# Patient Record
Sex: Female | Born: 1945
Health system: Southern US, Community
[De-identification: ages and names within clinical notes are randomized; demographics above are authoritative.]

## PROBLEM LIST (undated history)

## (undated) DIAGNOSIS — R109 Unspecified abdominal pain: Secondary | ICD-10-CM

## (undated) DIAGNOSIS — R197 Diarrhea, unspecified: Secondary | ICD-10-CM

## (undated) DIAGNOSIS — I340 Nonrheumatic mitral (valve) insufficiency: Secondary | ICD-10-CM

## (undated) DIAGNOSIS — Z87898 Personal history of other specified conditions: Secondary | ICD-10-CM

## (undated) DIAGNOSIS — R7989 Other specified abnormal findings of blood chemistry: Secondary | ICD-10-CM

## (undated) DIAGNOSIS — D649 Anemia, unspecified: Secondary | ICD-10-CM

## (undated) DIAGNOSIS — I10 Essential (primary) hypertension: Secondary | ICD-10-CM

## (undated) DIAGNOSIS — R0600 Dyspnea, unspecified: Secondary | ICD-10-CM

## (undated) DIAGNOSIS — R778 Other specified abnormalities of plasma proteins: Secondary | ICD-10-CM

## (undated) DIAGNOSIS — T7840XA Allergy, unspecified, initial encounter: Secondary | ICD-10-CM

## (undated) HISTORY — PX: ABDOMINAL HYSTERECTOMY: SHX81

## (undated) HISTORY — DX: Allergy, unspecified, initial encounter: T78.40XA

---

## 2004-08-03 ENCOUNTER — Ambulatory Visit: Payer: Self-pay | Admitting: General Surgery

## 2005-09-19 ENCOUNTER — Ambulatory Visit: Payer: Self-pay | Admitting: General Surgery

## 2006-10-01 ENCOUNTER — Ambulatory Visit: Payer: Self-pay | Admitting: General Surgery

## 2007-10-02 ENCOUNTER — Ambulatory Visit: Payer: Self-pay | Admitting: General Surgery

## 2008-10-07 ENCOUNTER — Ambulatory Visit: Payer: Self-pay | Admitting: General Surgery

## 2009-10-10 ENCOUNTER — Ambulatory Visit: Payer: Self-pay | Admitting: General Surgery

## 2009-10-26 ENCOUNTER — Ambulatory Visit: Payer: Self-pay | Admitting: General Surgery

## 2010-10-18 ENCOUNTER — Ambulatory Visit: Payer: Self-pay | Admitting: Family Medicine

## 2016-03-02 ENCOUNTER — Encounter: Payer: Self-pay | Admitting: Emergency Medicine

## 2016-03-02 ENCOUNTER — Observation Stay
Admission: EM | Admit: 2016-03-02 | Discharge: 2016-03-03 | Disposition: A | Discharge status: Home / Self Care | Payer: PPO | Attending: Internal Medicine | Admitting: Internal Medicine

## 2016-03-02 DIAGNOSIS — D509 Iron deficiency anemia, unspecified: Secondary | ICD-10-CM | POA: Insufficient documentation

## 2016-03-02 DIAGNOSIS — I10 Essential (primary) hypertension: Secondary | ICD-10-CM | POA: Diagnosis not present

## 2016-03-02 DIAGNOSIS — R55 Syncope and collapse: Secondary | ICD-10-CM | POA: Diagnosis not present

## 2016-03-02 DIAGNOSIS — R748 Abnormal levels of other serum enzymes: Secondary | ICD-10-CM | POA: Diagnosis not present

## 2016-03-02 DIAGNOSIS — R531 Weakness: Secondary | ICD-10-CM

## 2016-03-02 DIAGNOSIS — E876 Hypokalemia: Secondary | ICD-10-CM | POA: Insufficient documentation

## 2016-03-02 DIAGNOSIS — Z8249 Family history of ischemic heart disease and other diseases of the circulatory system: Secondary | ICD-10-CM | POA: Insufficient documentation

## 2016-03-02 DIAGNOSIS — I959 Hypotension, unspecified: Secondary | ICD-10-CM | POA: Insufficient documentation

## 2016-03-02 DIAGNOSIS — R42 Dizziness and giddiness: Secondary | ICD-10-CM | POA: Diagnosis not present

## 2016-03-02 DIAGNOSIS — Z90710 Acquired absence of both cervix and uterus: Secondary | ICD-10-CM | POA: Insufficient documentation

## 2016-03-02 HISTORY — DX: Essential (primary) hypertension: I10

## 2016-03-02 LAB — URINALYSIS, COMPLETE (UACMP) WITH MICROSCOPIC
BILIRUBIN URINE: NEGATIVE
Bacteria, UA: NONE SEEN
GLUCOSE, UA: NEGATIVE mg/dL
HGB URINE DIPSTICK: NEGATIVE
KETONES UR: NEGATIVE mg/dL
LEUKOCYTES UA: NEGATIVE
Nitrite: NEGATIVE
PROTEIN: NEGATIVE mg/dL
RBC / HPF: NONE SEEN RBC/hpf (ref 0–5)
Specific Gravity, Urine: 1.005 (ref 1.005–1.030)
Squamous Epithelial / LPF: NONE SEEN
pH: 7 (ref 5.0–8.0)

## 2016-03-02 LAB — BASIC METABOLIC PANEL
ANION GAP: 8 (ref 5–15)
BUN: 13 mg/dL (ref 6–20)
CALCIUM: 8.6 mg/dL — AB (ref 8.9–10.3)
CHLORIDE: 105 mmol/L (ref 101–111)
CO2: 25 mmol/L (ref 22–32)
CREATININE: 1.05 mg/dL — AB (ref 0.44–1.00)
GFR calc non Af Amer: 53 mL/min — ABNORMAL LOW (ref >60)
Glucose, Bld: 118 mg/dL — ABNORMAL HIGH (ref 65–99)
Potassium: 3.1 mmol/L — ABNORMAL LOW (ref 3.5–5.1)
SODIUM: 138 mmol/L (ref 135–145)

## 2016-03-02 LAB — TROPONIN I
Troponin I: 0.07 ng/mL (ref <0.03)
Troponin I: 0.56 ng/mL (ref <0.03)

## 2016-03-02 LAB — CBC
HCT: 30.8 % — ABNORMAL LOW (ref 35.0–47.0)
HEMOGLOBIN: 9.4 g/dL — AB (ref 12.0–16.0)
MCH: 21.3 pg — ABNORMAL LOW (ref 26.0–34.0)
MCHC: 30.5 g/dL — ABNORMAL LOW (ref 32.0–36.0)
MCV: 69.7 fL — AB (ref 80.0–100.0)
PLATELETS: 258 10*3/uL (ref 150–440)
RBC: 4.42 MIL/uL (ref 3.80–5.20)
RDW: 18.5 % — ABNORMAL HIGH (ref 11.5–14.5)
WBC: 5.8 10*3/uL (ref 3.6–11.0)

## 2016-03-02 LAB — IRON AND TIBC
Iron: 16 ug/dL — ABNORMAL LOW (ref 28–170)
SATURATION RATIOS: 3 % — AB (ref 10.4–31.8)
TIBC: 493 ug/dL — AB (ref 250–450)
UIBC: 477 ug/dL

## 2016-03-02 LAB — TSH: TSH: 1.469 u[IU]/mL (ref 0.350–4.500)

## 2016-03-02 LAB — CORTISOL: CORTISOL PLASMA: 8.7 ug/dL

## 2016-03-02 LAB — INFLUENZA PANEL BY PCR (TYPE A & B)
Influenza A By PCR: NEGATIVE
Influenza B By PCR: NEGATIVE

## 2016-03-02 LAB — FERRITIN: Ferritin: 5 ng/mL — ABNORMAL LOW (ref 11–307)

## 2016-03-02 MED ORDER — ENOXAPARIN SODIUM 100 MG/ML ~~LOC~~ SOLN
1.0000 mg/kg | Freq: Two times a day (BID) | SUBCUTANEOUS | Status: DC
  Administered 2016-03-02 – 2016-03-03 (×2): 95 mg via SUBCUTANEOUS
  Filled 2016-03-02 (×2): qty 1

## 2016-03-02 MED ORDER — HYDRALAZINE HCL 20 MG/ML IJ SOLN
INTRAMUSCULAR | Status: AC
  Filled 2016-03-02: qty 1

## 2016-03-02 MED ORDER — HYDRALAZINE HCL 20 MG/ML IJ SOLN: 10.0000 mg | Freq: Four times a day (QID) | INTRAMUSCULAR | Status: DC | PRN

## 2016-03-02 MED ORDER — ASPIRIN EC 81 MG PO TBEC
81.0000 mg | DELAYED_RELEASE_TABLET | Freq: Every day | ORAL | Status: DC
  Administered 2016-03-03: 81 mg via ORAL
  Filled 2016-03-02 (×2): qty 1

## 2016-03-02 MED ORDER — POTASSIUM CHLORIDE IN NACL 40-0.9 MEQ/L-% IV SOLN
INTRAVENOUS | Status: DC
  Administered 2016-03-02: 50 mL/h via INTRAVENOUS
  Filled 2016-03-02 (×2): qty 1000

## 2016-03-02 MED ORDER — ONDANSETRON HCL 4 MG PO TABS: 4.0000 mg | ORAL_TABLET | Freq: Four times a day (QID) | ORAL | Status: DC | PRN

## 2016-03-02 MED ORDER — ENOXAPARIN SODIUM 40 MG/0.4ML ~~LOC~~ SOLN: 40.0000 mg | SUBCUTANEOUS | Status: DC

## 2016-03-02 MED ORDER — SODIUM CHLORIDE 0.9% FLUSH
3.0000 mL | Freq: Two times a day (BID) | INTRAVENOUS | Status: DC
  Administered 2016-03-02: 3 mL via INTRAVENOUS

## 2016-03-02 MED ORDER — HYDRALAZINE HCL 20 MG/ML IJ SOLN
10.0000 mg | Freq: Once | INTRAMUSCULAR | Status: AC
  Administered 2016-03-02: 10 mg via INTRAVENOUS

## 2016-03-02 MED ORDER — ASPIRIN 81 MG PO CHEW
324.0000 mg | CHEWABLE_TABLET | Freq: Once | ORAL | Status: AC
  Administered 2016-03-02: 324 mg via ORAL
  Filled 2016-03-02: qty 4

## 2016-03-02 MED ORDER — GUAIFENESIN ER 600 MG PO TB12: 600.0000 mg | ORAL_TABLET | Freq: Two times a day (BID) | ORAL | Status: DC | PRN

## 2016-03-02 MED ORDER — ACETAMINOPHEN 650 MG RE SUPP: 650.0000 mg | Freq: Four times a day (QID) | RECTAL | Status: DC | PRN

## 2016-03-02 MED ORDER — ACETAMINOPHEN 325 MG PO TABS: 650.0000 mg | ORAL_TABLET | Freq: Four times a day (QID) | ORAL | Status: DC | PRN

## 2016-03-02 MED ORDER — ENOXAPARIN SODIUM 100 MG/ML ~~LOC~~ SOLN: 95.0000 mg | Freq: Two times a day (BID) | SUBCUTANEOUS | Status: DC

## 2016-03-02 MED ORDER — ONDANSETRON HCL 4 MG/2ML IJ SOLN: 4.0000 mg | Freq: Four times a day (QID) | INTRAMUSCULAR | Status: DC | PRN

## 2016-03-02 NOTE — ED Triage Notes (Signed)
Pt states she woke up this AM and felt dizzy and weak. Pt states she took her BP and at that time it was 98/46. Pt states she is still feeling dizzy and nausea.

## 2016-03-02 NOTE — ED Provider Notes (Signed)
[facility] Emergency Department Provider Note  Time seen: 12:54 PM  I have reviewed the triage vital signs and the nursing notes.   HISTORY  Chief Complaint Dizziness    HPI Dawn Gonzalez is a 71 y.o. female who presents to the emergency department for lightheadedness. According to the patient this morning while she was fixing breakfast she began feeling lightheaded. She sat down, ate something drink some fluids, was beginning to feel better she took her blood pressure noted it to be less than [id number] systolic which is very abnormal for her. Patient states she waited 10-15 minutes took it again and it remained low so she came to the emergency department for evaluation. Patient states now she feels normal. She did state she got nauseated when she first felt lightheaded and had one episode of vomiting. Denies any shortness of breath or diaphoresis. Denies any chest pain at any point. Patient states currently she feels back to normal.  History reviewed. No pertinent past medical history.  There are no active problems to display for this patient.   Past Surgical History:  Procedure Laterality Date  . ABDOMINAL HYSTERECTOMY      Prior to Admission medications   Not on File    No Known Allergies  No family history on file.  Social History Social History  Substance Use Topics  . Smoking status: Never Smoker  . Smokeless tobacco: Never Used  . Alcohol use No    Review of Systems Constitutional: Negative for fever. Cardiovascular: Negative for chest pain. Respiratory: Negative for shortness of breath. Gastrointestinal: Negative for abdominal pain Genitourinary: Negative for dysuria. Neurological: Negative for headaches, focal weakness or numbness. 10-point ROS otherwise negative.  ____________________________________________   PHYSICAL EXAM:  VITAL SIGNS: ED Triage Vitals  Enc Vitals Group     BP 03/02/16 1049 127/66     Pulse Rate 03/02/16  1049 78     Resp 03/02/16 1049 18     Temp 03/02/16 1049 98.1 F (36.7 C)     Temp Source 03/02/16 1049 Oral     SpO2 03/02/16 1049 99 %     Weight 03/02/16 1050 210 lb (95.3 kg)     Height 03/02/16 1050 5\' 4"  (1.626 m)     Head Circumference --      Peak Flow --      Pain Score --      Pain Loc --      Pain Edu? --      Excl. in [place]? --     Constitutional: Alert and oriented. Well appearing and in no distress. Eyes: Normal exam ENT   Head: Normocephalic and atraumatic.   Mouth/Throat: Mucous membranes are moist. Cardiovascular: Normal rate, regular rhythm. No murmur Respiratory: Normal respiratory effort without tachypnea nor retractions. Breath sounds are clear Gastrointestinal: Soft and nontender. No distention. Musculoskeletal: Nontender with normal range of motion in all extremities. No lower extremity tenderness or edema. Neurologic:  Normal speech and language. No gross focal neurologic deficits Skin:  Skin is warm, dry and intact.  Psychiatric: Mood and affect are normal.  ____________________________________________    EKG  EKG reviewed and interpreted by myself shows normal sinus rhythm at 75 bpm, narrow QRS, normal axis, less than normal intervals besides a slightly prolonged QTC at 538 ms, nonspecific ST changes without ST elevation.  ____________________________________________    INITIAL IMPRESSION / ASSESSMENT AND PLAN / ED COURSE  Pertinent labs & imaging results that were available during my care of  the patient were reviewed by me and considered in my medical decision making (see chart for details).  Patient presents the emergency department with lightheadedness and low blood pressure. Upon arrival patient's blood pressure had normalized. Patient states she used to be on hypertensive medications although her blood pressure came down on its own and her doctor took her off these medications several years ago. Patient does state she became nauseated  with lightheadedness had one episode of vomiting. Patient states since arriving to the emergency department she has felt well. Denies any chest pain at any point. Denies any diaphoresis or shortness of breath at any point. We will check labs, close monitoring in the emergency department. Patient's symptoms are suggestive of a possible vagal episode.  Patient's labs are resulted largely within normal limits. Kidney function is normal. We will add on a troponin, we will also repeat a troponin at 2 PM. At the patient's workup remains negative and the patient remains asymptomatic anticipate likely discharge home with cardiology follow-up for further testing  Patient's repeat troponin is elevated 0.07 given the patient's episode of hypotension with near syncope and nausea this morning patient will be admitted to the hospital for further workup.  ____________________________________________   FINAL CLINICAL IMPRESSION(S) / ED DIAGNOSES  Lightheadedness Near syncope Weakness   Harvest Dark, MD 03/02/16 1436

## 2016-03-02 NOTE — H&P (Signed)
[place] [facility] [place] NAME: Dawn Gonzalez    MR#:  [id number]  DATE OF BIRTH:  01/29/1946  DATE OF ADMISSION:  03/02/2016  PRIMARY CARE PHYSICIAN: Wilhemena Durie, MD   REQUESTING/REFERRING PHYSICIAN: Harvest Dark MD  CHIEF COMPLAINT:   Chief Complaint  Patient presents with  . Dizziness    HISTORY OF PRESENT ILLNESS: Dawn Gonzalez  is a 71 y.o. female with a known history of Essential hypertension was on medications previously currently not on any medications presents with complaint of having dizziness. She reports that she was doing well up until this morning around 9:00 when she started feeling dizzy. She does have a blood pressure cuff. He checks her blood pressure to 3 times a week. Her blood pressure was noted to be in the 90s earlier this morning She was previously on medications but not anymore. She reports of some dry cough and right ear discomfort but other than that she has no other complaints.  PAST MEDICAL HISTORY:   Past Medical History:  Diagnosis Date  . Hypertension     PAST SURGICAL HISTORY:  Past Surgical History:  Procedure Laterality Date  . ABDOMINAL HYSTERECTOMY      SOCIAL HISTORY:  Social History  Substance Use Topics  . Smoking status: Never Smoker  . Smokeless tobacco: Never Used  . Alcohol use No    FAMILY HISTORY:  Family History  Problem Relation Age of Onset  . Hypertension Mother     DRUG ALLERGIES: No Known Allergies  REVIEW OF SYSTEMS:   CONSTITUTIONAL: No fever,Positive fatigue and weakness.  EYES: No blurred or double vision.  EARS, NOSE, AND THROAT: No tinnitus or ear pain.  RESPIRATORY: No cough, shortness of breath, wheezing or hemoptysis.  CARDIOVASCULAR: No chest pain, orthopnea, edema.  GASTROINTESTINAL: No nausea, vomiting, diarrhea or abdominal pain.  GENITOURINARY: No dysuria, hematuria.  ENDOCRINE: No polyuria, nocturia,  HEMATOLOGY: No anemia, easy bruising or  bleeding SKIN: No rash or lesion. MUSCULOSKELETAL: No joint pain or arthritis.   NEUROLOGIC: No tingling, numbness, weakness.  PSYCHIATRY: No anxiety or depression.   MEDICATIONS AT HOME:  Prior to Admission medications   Medication Sig Start Date End Date Taking? Authorizing Provider  guaiFENesin (MUCINEX) 600 MG 12 hr tablet Take 600 mg by mouth 2 (two) times daily.   Yes Historical Provider, MD      PHYSICAL EXAMINATION:   VITAL SIGNS: Blood pressure 127/66, pulse 78, temperature 98.1 F (36.7 C), temperature source Oral, resp. rate 18, height 5\' 4"  (1.626 m), weight 210 lb (95.3 kg), SpO2 99 %.  GENERAL:  71 y.o.-year-old patient lying in the bed with no acute distress.  EYES: Pupils equal, round, reactive to light and accommodation. No scleral icterus. Extraocular muscles intact.  HEENT: Head atraumatic, normocephalic. Oropharynx and nasopharynx clear.  NECK:  Supple, no jugular venous distention. No thyroid enlargement, no tenderness.  LUNGS: Normal breath sounds bilaterally, no wheezing, rales,rhonchi or crepitation. No use of accessory muscles of respiration.  CARDIOVASCULAR: S1, S2 normal. No murmurs, rubs, or gallops.  ABDOMEN: Soft, nontender, nondistended. Bowel sounds present. No organomegaly or mass.  EXTREMITIES: 1+ pedal edema, cyanosis, or clubbing.  NEUROLOGIC: Cranial nerves II through XII are intact. Muscle strength 5/5 in all extremities. Sensation intact. Gait not checked.  PSYCHIATRIC: The patient is alert and oriented x 3.  SKIN: No obvious rash, lesion, or ulcer.   LABORATORY PANEL:   CBC  Recent Labs Lab 03/02/16 1053  WBC  5.8  HGB 9.4*  HCT 30.8*  PLT 258  MCV 69.7*  MCH 21.3*  MCHC 30.5*  RDW 18.5*   ------------------------------------------------------------------------------------------------------------------  Chemistries   Recent Labs Lab 03/02/16 1053  NA 138  K 3.1*  CL 105  CO2 25  GLUCOSE 118*  BUN 13  CREATININE 1.05*   CALCIUM 8.6*   ------------------------------------------------------------------------------------------------------------------ estimated creatinine clearance is 55.8 mL/min (by C-G formula based on SCr of 1.05 mg/dL (H)). ------------------------------------------------------------------------------------------------------------------ No results for input(s): TSH, T4TOTAL, T3FREE, THYROIDAB in the last 72 hours.  Invalid input(s): FREET3   Coagulation profile No results for input(s): INR, PROTIME in the last 168 hours. ------------------------------------------------------------------------------------------------------------------- No results for input(s): DDIMER in the last 72 hours. -------------------------------------------------------------------------------------------------------------------  Cardiac Enzymes  Recent Labs Lab 03/02/16 1053 03/02/16 1343  TROPONINI <0.03 0.07*   ------------------------------------------------------------------------------------------------------------------ Invalid input(s): POCBNP  ---------------------------------------------------------------------------------------------------------------  Urinalysis    Component Value Date/Time   COLORURINE STRAW (A) 03/02/2016 1053   APPEARANCEUR CLEAR (A) 03/02/2016 1053   LABSPEC 1.005 03/02/2016 1053   PHURINE 7.0 03/02/2016 1053   GLUCOSEU NEGATIVE 03/02/2016 1053   HGBUR NEGATIVE 03/02/2016 1053   BILIRUBINUR NEGATIVE 03/02/2016 1053   KETONESUR NEGATIVE 03/02/2016 1053   PROTEINUR NEGATIVE 03/02/2016 1053   NITRITE NEGATIVE 03/02/2016 1053   LEUKOCYTESUR NEGATIVE 03/02/2016 1053     RADIOLOGY: No results found.  EKG: Orders placed or performed during the hospital encounter of 03/02/16  . ED EKG  . ED EKG    IMPRESSION AND PLAN: Patient is 71 year old presenting with complaint of generalized weakness dizziness  1. Dizziness suspect due to blood pressure being low We  will give her IV fluids Check orthostatics in the morning  2. Hypotension of unclear etiology We'll obtain echocardiogram of the heart Cortisol level  3. Hypokalemia we will replace check cortisol  4. Microcytic anemia will guaiac stool check iron and ferritin level  5. Miscellaneous Lovenox for DVT prophylaxis   All the records are reviewed and case discussed with ED provider. Management plans discussed with the patient, family and they are in agreement.  CODE STATUS: Code Status History    This patient does not have a recorded code status. Please follow your organizational policy for patients in this situation.       TOTAL TIME TAKING CARE OF THIS PATIENT:50 minutes.    Dustin Flock M.D on 03/02/2016 at 3:40 PM  Between 7am to 6pm - Pager - [phone]  After 6pm go to [email or web address] - password [email or web address] [place] [facility]  Office  [phone]  CC: Primary care physician; Wilhemena Durie, MD

## 2016-03-02 NOTE — ED Notes (Signed)
Patient states that this morning about 0915 she started feeling lightheaded and nauseated. Patient states that she checked her blood pressure and it was 98/46. Patient has history of high blood pressure but has not been on medication for her blood pressure in about 3 years. Patient denies sensation of the room spinning. Patient denies recently illness, fevers, chills, or chest pain. Patient denies numbness or weakness. Patient states that she has noticed some increased swelling in her legs and some shortness of breath.   Patient states that she does not feel lightheaded now but she feels "unsettled." Patient denies nausea at this time.

## 2016-03-03 DIAGNOSIS — R42 Dizziness and giddiness: Secondary | ICD-10-CM | POA: Diagnosis not present

## 2016-03-03 DIAGNOSIS — R748 Abnormal levels of other serum enzymes: Secondary | ICD-10-CM | POA: Diagnosis not present

## 2016-03-03 DIAGNOSIS — E876 Hypokalemia: Secondary | ICD-10-CM | POA: Diagnosis not present

## 2016-03-03 DIAGNOSIS — I959 Hypotension, unspecified: Secondary | ICD-10-CM | POA: Diagnosis not present

## 2016-03-03 LAB — BASIC METABOLIC PANEL
Anion gap: 9 (ref 5–15)
BUN: 12 mg/dL (ref 6–20)
CALCIUM: 8.3 mg/dL — AB (ref 8.9–10.3)
CO2: 24 mmol/L (ref 22–32)
CREATININE: 1.06 mg/dL — AB (ref 0.44–1.00)
Chloride: 104 mmol/L (ref 101–111)
GFR calc Af Amer: >60 mL/min (ref >60)
GFR, EST NON AFRICAN AMERICAN: 52 mL/min — AB (ref >60)
Glucose, Bld: 91 mg/dL (ref 65–99)
Potassium: 3.4 mmol/L — ABNORMAL LOW (ref 3.5–5.1)
SODIUM: 137 mmol/L (ref 135–145)

## 2016-03-03 LAB — CBC
HCT: 26.8 % — ABNORMAL LOW (ref 35.0–47.0)
Hemoglobin: 8.3 g/dL — ABNORMAL LOW (ref 12.0–16.0)
MCH: 21 pg — ABNORMAL LOW (ref 26.0–34.0)
MCHC: 31 g/dL — AB (ref 32.0–36.0)
MCV: 67.8 fL — ABNORMAL LOW (ref 80.0–100.0)
PLATELETS: 239 10*3/uL (ref 150–440)
RBC: 3.95 MIL/uL (ref 3.80–5.20)
RDW: 18.1 % — ABNORMAL HIGH (ref 11.5–14.5)
WBC: 8.1 10*3/uL (ref 3.6–11.0)

## 2016-03-03 LAB — HEMOGLOBIN A1C
HEMOGLOBIN A1C: 5.8 % — AB (ref 4.8–5.6)
MEAN PLASMA GLUCOSE: 120 mg/dL

## 2016-03-03 LAB — TROPONIN I: TROPONIN I: 0.54 ng/mL — AB (ref <0.03)

## 2016-03-03 MED ORDER — PNEUMOCOCCAL VAC POLYVALENT 25 MCG/0.5ML IJ INJ: 0.5000 mL | INJECTION | INTRAMUSCULAR | Status: DC

## 2016-03-03 NOTE — Consult Note (Signed)
[facility] Cardiology  CARDIOLOGY CONSULT NOTE  Patient ID: Dawn Gonzalez MRN: [id number] DOB/AGE: 71/01/1946 71 y.o.  Admit date: 03/02/2016 Referring Physician Dr. Dustin Flock Primary Physician Dr. Miguel Aschoff Primary Cardiologist None Reason for Consultation Dizziness, elevated troponin  HPI: This is a 71 year old female referred for dizziness and elevated troponin. The patient has a history of hypertension, now diet controlled. Yesterday morning she noticed generalized weakness and lightheadedness, like she would faint. She denies chest pain, palpitations, heart racing, or syncope. She did vomit once but does not have any abdominal pain. At home her BP was noted to be low at 98/46.  Upon arrival the patient was feeling improved. BP was normal. EKG was unremarkable. Troponin trend showed 0.07 > 0.56 > 0.54. She is feeling back to baseline at present, she denies chest pain, dyspnea, abdominal pain, dizziness, or palpitations.  Review of systems complete and found to be negative unless listed above     Past Medical History:  Diagnosis Date  . Hypertension     Past Surgical History:  Procedure Laterality Date  . ABDOMINAL HYSTERECTOMY      Prescriptions Prior to Admission  Medication Sig Dispense Refill Last Dose  . guaiFENesin (MUCINEX) 600 MG 12 hr tablet Take 600 mg by mouth 2 (two) times daily.   PRN at PRN   Social History   Social History  . Marital status: Married    Spouse name: N/A  . Number of children: N/A  . Years of education: N/A   Occupational History  . Not on file.   Social History Main Topics  . Smoking status: Never Smoker  . Smokeless tobacco: Never Used  . Alcohol use No  . Drug use: No  . Sexual activity: Not on file   Other Topics Concern  . Not on file   Social History Narrative  . No narrative on file    Family History  Problem Relation Age of Onset  . Hypertension Mother   Brother with MI in his 72s    Review of systems complete  and found to be negative unless listed above      PHYSICAL EXAM  General: Well developed, well nourished, in no acute distress HEENT:  Normocephalic and atramatic Neck:  No JVD.  Lungs: Clear bilaterally to auscultation and percussion. Heart: HRRR . Normal S1 and S2 without gallops or murmurs.  Abdomen: Bowel sounds are positive, abdomen soft and non-tender  Msk:  Back normal, normal gait. Normal strength and tone for age. Extremities: No clubbing, cyanosis or edema.   Neuro: Alert and oriented X 3. Psych:  Good affect, responds appropriately  Labs:   Lab Results  Component Value Date   WBC 8.1 03/03/2016   HGB 8.3 (L) 03/03/2016   HCT 26.8 (L) 03/03/2016   MCV 67.8 (L) 03/03/2016   PLT 239 03/03/2016    Recent Labs Lab 03/03/16 0043  NA 137  K 3.4*  CL 104  CO2 24  BUN 12  CREATININE 1.06*  CALCIUM 8.3*  GLUCOSE 91   Lab Results  Component Value Date   TROPONINI 0.54 ([place]) 03/03/2016   No results found for: CHOL No results found for: HDL No results found for: LDLCALC No results found for: TRIG No results found for: CHOLHDL No results found for: LDLDIRECT    Radiology: No results found.  EKG: NSR, no ischemic changes  ASSESSMENT AND PLAN:   1. Weakness near syncope, possibly related to hypotension 2. Elevated troponin, stable, EKG  without ischemic changes  Recommendations  1. Recommend patient ambulate today 2. Read 2D echo 3. Outpatient stress test if patient continues to do well today 4. Case discussed with Dr. Posey Pronto 5. Further recommendations pending clinical course   The patient was seen under the supervision of Dr. Saralyn Pilar.   SignedRobby Sermon PA-S 03/03/2016, 10:27 AM

## 2016-03-03 NOTE — Progress Notes (Signed)
As per Echo tech, they are running way behind the schedule due to lot of Echo orders. They are still working on orders from yesterday morning. And due to weekend- Echo tech will be leaving soon. Will not be able to do her echo till atleast tomorrow , or may be Monday.  Pt walked fine, without any symptoms. She already have contact details of cardiologist office to make appointment for further work ups.   D/c home today.

## 2016-03-03 NOTE — Progress Notes (Signed)
Ambulated patient around the unit. Patient tolerated ambulation well. No dizziness observed. Will continue to monitor.

## 2016-03-03 NOTE — Progress Notes (Signed)
Patient discharged per MD order and hospital protocol. Patient verbalized understanding of medications, discharge instructions and follow up appointment. Patient was escorted off unit by RN.

## 2016-03-03 NOTE — Discharge Summary (Signed)
[place] at [place] NAME: Dawn Gonzalez    MR#:  [id number]  DATE OF BIRTH:  01/29/1946  DATE OF ADMISSION:  03/02/2016 ADMITTING PHYSICIAN: Dustin Flock, MD  DATE OF DISCHARGE: 03/03/2016  PRIMARY CARE PHYSICIAN: Wilhemena Durie, MD    ADMISSION DIAGNOSIS:  Weakness [R53.1] Near syncope [R55]  DISCHARGE DIAGNOSIS:  Active Problems:   Dizziness   SECONDARY DIAGNOSIS:   Past Medical History:  Diagnosis Date  . Hypertension     HOSPITAL COURSE:   1. Dizziness suspect due to blood pressure being low given IV fluids Pt was fine without any symptoms on walking next day morning.  2. Hypotension of unclear etiology We'll obtain echocardiogram of the heart Cortisol level- normal.  3. Hypokalemia replaced  4. Microcytic anemia    Iron and ferritin level are normal. Spoke to pt she never had colonoscopy.   Advised to have it done as out pt.   If, she have BM- need to collect stool for guiac, otherwise- she can also follow with her PMD.  5. Miscellaneous Lovenox for DVT prophylaxis   DISCHARGE CONDITIONS:   Stable.  CONSULTS OBTAINED:  Treatment Team:  Isaias Cowman, MD  DRUG ALLERGIES:  No Known Allergies  DISCHARGE MEDICATIONS:   Current Discharge Medication List    CONTINUE these medications which have NOT CHANGED   Details  guaiFENesin (MUCINEX) 600 MG 12 hr tablet Take 600 mg by mouth 2 (two) times daily.         DISCHARGE INSTRUCTIONS:    Follow with PMD in 1-2 weeks to arrange for colonoscopy for low Hb.  FOllow with Cardiology clinic in 1 week.  If you experience worsening of your admission symptoms, develop shortness of breath, life threatening emergency, suicidal or homicidal thoughts you must seek medical attention immediately by calling 911 or calling your MD immediately  if symptoms less severe.  You Must read complete instructions/literature along with all the possible  adverse reactions/side effects for all the Medicines you take and that have been prescribed to you. Take any new Medicines after you have completely understood and accept all the possible adverse reactions/side effects.   Please note  You were cared for by a hospitalist during your hospital stay. If you have any questions about your discharge medications or the care you received while you were in the hospital after you are discharged, you can call the unit and asked to speak with the hospitalist on call if the hospitalist that took care of you is not available. Once you are discharged, your primary care physician will handle any further medical issues. Please note that NO REFILLS for any discharge medications will be authorized once you are discharged, as it is imperative that you return to your primary care physician (or establish a relationship with a primary care physician if you do not have one) for your aftercare needs so that they can reassess your need for medications and monitor your lab values.    Today   CHIEF COMPLAINT:   Chief Complaint  Patient presents with  . Dizziness    HISTORY OF PRESENT ILLNESS:  Dawn Gonzalez  is a 71 y.o. female with a known history of Essential hypertension was on medications previously currently not on any medications presents with complaint of having dizziness. She reports that she was doing well up until this morning around 9:00 when she started feeling dizzy. She does have a blood pressure cuff. He checks her blood  pressure to 3 times a week. Her blood pressure was noted to be in the 90s earlier this morning She was previously on medications but not anymore. She reports of some dry cough and right ear discomfort but other than that she has no other complaints.  VITAL SIGNS:  Blood pressure (!) 156/81, pulse 80, temperature 98 F (36.7 C), temperature source Oral, resp. rate 14, height 5\' 4"  (1.626 m), weight 95.3 kg (210 lb), SpO2 98 %.  I/O:    Intake/Output Summary (Last 24 hours) at 03/03/16 1208 Last data filed at 03/03/16 1206  Gross per 24 hour  Intake              480 ml  Output                0 ml  Net              480 ml    PHYSICAL EXAMINATION:  GENERAL:  71 y.o.-year-old patient lying in the bed with no acute distress.  EYES: Pupils equal, round, reactive to light and accommodation. No scleral icterus. Extraocular muscles intact.  HEENT: Head atraumatic, normocephalic. Oropharynx and nasopharynx clear.  NECK:  Supple, no jugular venous distention. No thyroid enlargement, no tenderness.  LUNGS: Normal breath sounds bilaterally, no wheezing, rales,rhonchi or crepitation. No use of accessory muscles of respiration.  CARDIOVASCULAR: S1, S2 normal. No murmurs, rubs, or gallops.  ABDOMEN: Soft, non-tender, non-distended. Bowel sounds present. No organomegaly or mass.  EXTREMITIES: No pedal edema, cyanosis, or clubbing.  NEUROLOGIC: Cranial nerves II through XII are intact. Muscle strength 5/5 in all extremities. Sensation intact. Gait not checked.  PSYCHIATRIC: The patient is alert and oriented x 3.  SKIN: No obvious rash, lesion, or ulcer.   DATA REVIEW:   CBC  Recent Labs Lab 03/03/16 0043  WBC 8.1  HGB 8.3*  HCT 26.8*  PLT 239    Chemistries   Recent Labs Lab 03/03/16 0043  NA 137  K 3.4*  CL 104  CO2 24  GLUCOSE 91  BUN 12  CREATININE 1.06*  CALCIUM 8.3*    Cardiac Enzymes  Recent Labs Lab 03/03/16 0043  TROPONINI 0.54*    Microbiology Results  No results found for this or any previous visit.  RADIOLOGY:  No results found.  EKG:   Orders placed or performed during the hospital encounter of 03/02/16  . ED EKG  . ED EKG      Management plans discussed with the patient, family and they are in agreement.  CODE STATUS:     Code Status Orders        Start     Ordered   03/02/16 1853  Full code  Continuous     03/02/16 1853    Code Status History    Date Active Date  Inactive Code Status Order ID Comments User Context   This patient has a current code status but no historical code status.      TOTAL TIME TAKING CARE OF THIS PATIENT: 35 minutes.    Vaughan Basta M.D on 03/03/2016 at 12:08 PM  Between 7am to 6pm - Pager - [phone]  After 6pm go to [email or web address] - password [email or web address] [place] [facility]  Office  [phone]  CC: Primary care physician; Wilhemena Durie, MD   Note: This dictation was prepared with Dragon dictation along with smaller phrase technology. Any transcriptional errors that result from this process are unintentional.

## 2016-03-14 DIAGNOSIS — I10 Essential (primary) hypertension: Secondary | ICD-10-CM | POA: Insufficient documentation

## 2016-03-14 DIAGNOSIS — R748 Abnormal levels of other serum enzymes: Secondary | ICD-10-CM | POA: Diagnosis not present

## 2016-03-14 DIAGNOSIS — R0602 Shortness of breath: Secondary | ICD-10-CM | POA: Insufficient documentation

## 2016-03-14 DIAGNOSIS — Z87898 Personal history of other specified conditions: Secondary | ICD-10-CM | POA: Diagnosis not present

## 2016-04-11 DIAGNOSIS — R0602 Shortness of breath: Secondary | ICD-10-CM | POA: Diagnosis not present

## 2016-04-11 DIAGNOSIS — Z87898 Personal history of other specified conditions: Secondary | ICD-10-CM | POA: Diagnosis not present

## 2016-04-11 DIAGNOSIS — R748 Abnormal levels of other serum enzymes: Secondary | ICD-10-CM | POA: Diagnosis not present

## 2016-04-20 DIAGNOSIS — I10 Essential (primary) hypertension: Secondary | ICD-10-CM | POA: Diagnosis not present

## 2016-04-20 DIAGNOSIS — R0602 Shortness of breath: Secondary | ICD-10-CM | POA: Diagnosis not present

## 2016-04-20 DIAGNOSIS — R748 Abnormal levels of other serum enzymes: Secondary | ICD-10-CM | POA: Diagnosis not present

## 2016-04-20 DIAGNOSIS — Z87898 Personal history of other specified conditions: Secondary | ICD-10-CM | POA: Diagnosis not present

## 2016-10-10 DIAGNOSIS — Z87898 Personal history of other specified conditions: Secondary | ICD-10-CM | POA: Diagnosis not present

## 2016-10-10 DIAGNOSIS — I10 Essential (primary) hypertension: Secondary | ICD-10-CM | POA: Diagnosis not present

## 2017-01-01 DIAGNOSIS — R0602 Shortness of breath: Secondary | ICD-10-CM | POA: Diagnosis not present

## 2017-01-01 DIAGNOSIS — R6889 Other general symptoms and signs: Secondary | ICD-10-CM | POA: Diagnosis not present

## 2017-01-01 DIAGNOSIS — Z1231 Encounter for screening mammogram for malignant neoplasm of breast: Secondary | ICD-10-CM | POA: Diagnosis not present

## 2017-01-01 DIAGNOSIS — Z1159 Encounter for screening for other viral diseases: Secondary | ICD-10-CM | POA: Diagnosis not present

## 2017-01-01 DIAGNOSIS — I10 Essential (primary) hypertension: Secondary | ICD-10-CM | POA: Diagnosis not present

## 2017-01-01 DIAGNOSIS — Z87898 Personal history of other specified conditions: Secondary | ICD-10-CM | POA: Diagnosis not present

## 2017-01-01 DIAGNOSIS — Z780 Asymptomatic menopausal state: Secondary | ICD-10-CM | POA: Diagnosis not present

## 2017-01-01 DIAGNOSIS — Z0000 Encounter for general adult medical examination without abnormal findings: Secondary | ICD-10-CM | POA: Diagnosis not present

## 2017-01-03 DIAGNOSIS — R0602 Shortness of breath: Secondary | ICD-10-CM | POA: Diagnosis not present

## 2017-01-03 DIAGNOSIS — D509 Iron deficiency anemia, unspecified: Secondary | ICD-10-CM | POA: Insufficient documentation

## 2017-01-03 DIAGNOSIS — I10 Essential (primary) hypertension: Secondary | ICD-10-CM | POA: Diagnosis not present

## 2017-01-16 DIAGNOSIS — Z780 Asymptomatic menopausal state: Secondary | ICD-10-CM | POA: Diagnosis not present

## 2017-01-16 DIAGNOSIS — M8588 Other specified disorders of bone density and structure, other site: Secondary | ICD-10-CM | POA: Diagnosis not present

## 2017-02-05 DIAGNOSIS — R0602 Shortness of breath: Secondary | ICD-10-CM | POA: Diagnosis not present

## 2017-02-05 DIAGNOSIS — I10 Essential (primary) hypertension: Secondary | ICD-10-CM | POA: Diagnosis not present

## 2017-02-05 DIAGNOSIS — Z87898 Personal history of other specified conditions: Secondary | ICD-10-CM | POA: Diagnosis not present

## 2017-02-05 DIAGNOSIS — D509 Iron deficiency anemia, unspecified: Secondary | ICD-10-CM | POA: Diagnosis not present

## 2017-02-06 DIAGNOSIS — Z0000 Encounter for general adult medical examination without abnormal findings: Secondary | ICD-10-CM | POA: Diagnosis not present

## 2017-03-20 DIAGNOSIS — D509 Iron deficiency anemia, unspecified: Secondary | ICD-10-CM | POA: Diagnosis not present

## 2017-05-14 ENCOUNTER — Ambulatory Visit: Admission: RE | Admit: 2017-05-14 | Payer: PPO | Source: Ambulatory Visit | Admitting: Gastroenterology

## 2017-05-14 ENCOUNTER — Encounter: Admission: RE | Payer: Self-pay | Source: Ambulatory Visit

## 2017-05-14 SURGERY — COLONOSCOPY WITH PROPOFOL
Anesthesia: General

## 2017-06-10 DIAGNOSIS — Z87898 Personal history of other specified conditions: Secondary | ICD-10-CM | POA: Diagnosis not present

## 2017-06-10 DIAGNOSIS — R748 Abnormal levels of other serum enzymes: Secondary | ICD-10-CM | POA: Diagnosis not present

## 2017-06-10 DIAGNOSIS — R0602 Shortness of breath: Secondary | ICD-10-CM | POA: Diagnosis not present

## 2017-06-10 DIAGNOSIS — I10 Essential (primary) hypertension: Secondary | ICD-10-CM | POA: Diagnosis not present

## 2017-07-24 ENCOUNTER — Telehealth: Payer: Self-pay | Admitting: Family Medicine

## 2017-07-24 NOTE — Telephone Encounter (Signed)
Dawn Gonzalez, Dawn Gonzalez is no longer a pt with [facility] she is with [facility]

## 2017-10-30 DIAGNOSIS — H524 Presbyopia: Secondary | ICD-10-CM | POA: Diagnosis not present

## 2017-10-30 DIAGNOSIS — H52222 Regular astigmatism, left eye: Secondary | ICD-10-CM | POA: Diagnosis not present

## 2017-10-30 DIAGNOSIS — H59812 Chorioretinal scars after surgery for detachment, left eye: Secondary | ICD-10-CM | POA: Diagnosis not present

## 2017-10-30 DIAGNOSIS — H5203 Hypermetropia, bilateral: Secondary | ICD-10-CM | POA: Diagnosis not present

## 2017-10-30 DIAGNOSIS — H2513 Age-related nuclear cataract, bilateral: Secondary | ICD-10-CM | POA: Diagnosis not present

## 2017-12-30 DIAGNOSIS — I340 Nonrheumatic mitral (valve) insufficiency: Secondary | ICD-10-CM | POA: Diagnosis not present

## 2017-12-30 DIAGNOSIS — I361 Nonrheumatic tricuspid (valve) insufficiency: Secondary | ICD-10-CM | POA: Insufficient documentation

## 2017-12-30 DIAGNOSIS — R0602 Shortness of breath: Secondary | ICD-10-CM | POA: Diagnosis not present

## 2017-12-30 DIAGNOSIS — Z87898 Personal history of other specified conditions: Secondary | ICD-10-CM | POA: Diagnosis not present

## 2017-12-30 DIAGNOSIS — I10 Essential (primary) hypertension: Secondary | ICD-10-CM | POA: Diagnosis not present

## 2018-07-09 DIAGNOSIS — I361 Nonrheumatic tricuspid (valve) insufficiency: Secondary | ICD-10-CM | POA: Diagnosis not present

## 2018-07-09 DIAGNOSIS — I10 Essential (primary) hypertension: Secondary | ICD-10-CM | POA: Diagnosis not present

## 2018-07-09 DIAGNOSIS — R0602 Shortness of breath: Secondary | ICD-10-CM | POA: Diagnosis not present

## 2018-07-09 DIAGNOSIS — I340 Nonrheumatic mitral (valve) insufficiency: Secondary | ICD-10-CM | POA: Diagnosis not present

## 2018-08-29 ENCOUNTER — Other Ambulatory Visit: Payer: Self-pay

## 2018-11-17 DIAGNOSIS — H5201 Hypermetropia, right eye: Secondary | ICD-10-CM | POA: Diagnosis not present

## 2018-11-17 DIAGNOSIS — H2513 Age-related nuclear cataract, bilateral: Secondary | ICD-10-CM | POA: Diagnosis not present

## 2018-11-17 DIAGNOSIS — H5212 Myopia, left eye: Secondary | ICD-10-CM | POA: Diagnosis not present

## 2018-11-17 DIAGNOSIS — H59812 Chorioretinal scars after surgery for detachment, left eye: Secondary | ICD-10-CM | POA: Diagnosis not present

## 2018-11-17 DIAGNOSIS — H52223 Regular astigmatism, bilateral: Secondary | ICD-10-CM | POA: Diagnosis not present

## 2018-11-17 DIAGNOSIS — H524 Presbyopia: Secondary | ICD-10-CM | POA: Diagnosis not present

## 2019-02-03 DIAGNOSIS — M1612 Unilateral primary osteoarthritis, left hip: Secondary | ICD-10-CM | POA: Diagnosis not present

## 2019-02-03 DIAGNOSIS — M25552 Pain in left hip: Secondary | ICD-10-CM | POA: Diagnosis not present

## 2019-04-15 ENCOUNTER — Other Ambulatory Visit: Payer: Self-pay

## 2019-04-15 ENCOUNTER — Encounter: Payer: Self-pay | Admitting: Radiology

## 2019-04-15 ENCOUNTER — Emergency Department
Admission: EM | Admit: 2019-04-15 | Discharge: 2019-04-15 | Disposition: A | Discharge status: Home / Self Care | Payer: PPO | Attending: Emergency Medicine | Admitting: Emergency Medicine

## 2019-04-15 ENCOUNTER — Emergency Department: Payer: PPO

## 2019-04-15 DIAGNOSIS — I10 Essential (primary) hypertension: Secondary | ICD-10-CM | POA: Diagnosis not present

## 2019-04-15 DIAGNOSIS — R1013 Epigastric pain: Secondary | ICD-10-CM | POA: Insufficient documentation

## 2019-04-15 DIAGNOSIS — K7689 Other specified diseases of liver: Secondary | ICD-10-CM | POA: Diagnosis not present

## 2019-04-15 DIAGNOSIS — K5730 Diverticulosis of large intestine without perforation or abscess without bleeding: Secondary | ICD-10-CM | POA: Diagnosis not present

## 2019-04-15 DIAGNOSIS — R1011 Right upper quadrant pain: Secondary | ICD-10-CM | POA: Diagnosis not present

## 2019-04-15 DIAGNOSIS — Z79899 Other long term (current) drug therapy: Secondary | ICD-10-CM | POA: Diagnosis not present

## 2019-04-15 LAB — CBC
HCT: 43.1 % (ref 36.0–46.0)
Hemoglobin: 14.1 g/dL (ref 12.0–15.0)
MCH: 28.6 pg (ref 26.0–34.0)
MCHC: 32.7 g/dL (ref 30.0–36.0)
MCV: 87.4 fL (ref 80.0–100.0)
Platelets: 249 10*3/uL (ref 150–400)
RBC: 4.93 MIL/uL (ref 3.87–5.11)
RDW: 13.7 % (ref 11.5–15.5)
WBC: 10.1 10*3/uL (ref 4.0–10.5)
nRBC: 0 % (ref 0.0–0.2)

## 2019-04-15 LAB — COMPREHENSIVE METABOLIC PANEL
ALT: 10 U/L (ref 0–44)
AST: 19 U/L (ref 15–41)
Albumin: 4 g/dL (ref 3.5–5.0)
Alkaline Phosphatase: 71 U/L (ref 38–126)
Anion gap: 10 (ref 5–15)
BUN: 13 mg/dL (ref 8–23)
CO2: 29 mmol/L (ref 22–32)
Calcium: 9.6 mg/dL (ref 8.9–10.3)
Chloride: 101 mmol/L (ref 98–111)
Creatinine, Ser: 1.08 mg/dL — ABNORMAL HIGH (ref 0.44–1.00)
GFR calc Af Amer: 59 mL/min — ABNORMAL LOW (ref >60)
GFR calc non Af Amer: 51 mL/min — ABNORMAL LOW (ref >60)
Glucose, Bld: 95 mg/dL (ref 70–99)
Potassium: 3.7 mmol/L (ref 3.5–5.1)
Sodium: 140 mmol/L (ref 135–145)
Total Bilirubin: 0.6 mg/dL (ref 0.3–1.2)
Total Protein: 7.9 g/dL (ref 6.5–8.1)

## 2019-04-15 LAB — LIPASE, BLOOD: Lipase: 26 U/L (ref 11–51)

## 2019-04-15 MED ORDER — PANTOPRAZOLE SODIUM 40 MG PO TBEC: 40.0000 mg | DELAYED_RELEASE_TABLET | Freq: Two times a day (BID) | ORAL | 1 refills | Status: DC

## 2019-04-15 MED ORDER — SODIUM CHLORIDE 0.9% FLUSH: 3.0000 mL | Freq: Once | INTRAVENOUS | Status: DC

## 2019-04-15 MED ORDER — IOHEXOL 300 MG/ML  SOLN
100.0000 mL | Freq: Once | INTRAMUSCULAR | Status: AC | PRN
  Administered 2019-04-15: 15:00:00 100 mL via INTRAVENOUS
  Filled 2019-04-15: qty 100

## 2019-04-15 MED ORDER — PANTOPRAZOLE SODIUM 40 MG PO TBEC
40.0000 mg | DELAYED_RELEASE_TABLET | Freq: Once | ORAL | Status: AC
  Administered 2019-04-15: 40 mg via ORAL
  Filled 2019-04-15: qty 1

## 2019-04-15 NOTE — ED Triage Notes (Signed)
Pt to ER with c/o upper abdominal pain for last several weeks.  Pt states pain increases with eating solid foods.  Pt reports n/v.

## 2019-04-15 NOTE — ED Provider Notes (Signed)
[facility] Emergency Department Provider Note  ____________________________________________   First MD Initiated Contact with Patient 04/15/19 1329     (approximate)  I have reviewed the triage vital signs and the nursing notes.   HISTORY  Chief Complaint Abdominal Pain   HPI Dawn Gonzalez is a 74 y.o. female with below listed previous medical conditions presents to the emergency department secondary to 9-day history of epigastric abdominal pain with vomiting.  Patient admits to very poor p.o. intake secondary to discomfort which begins approximately 1 hour after consuming solid food.  Patient denies any fever.  Patient denies any diarrhea or constipation.        Past Medical History:  Diagnosis Date  . Hypertension     Patient Active Problem List   Diagnosis Date Noted  . Dizziness 03/02/2016    Past Surgical History:  Procedure Laterality Date  . ABDOMINAL HYSTERECTOMY      Prior to Admission medications   Medication Sig Start Date End Date Taking? Authorizing Provider  calcium-vitamin D (OSCAL WITH D) 500-200 MG-UNIT tablet Take 1 tablet by mouth 3 (three) times a week. Takes three times a week   Yes [provider]  esomeprazole (NEXIUM) 20 MG capsule Take 20 mg by mouth daily at 12 noon.   Yes [provider]  hydrochlorothiazide (HYDRODIURIL) 25 MG tablet Take 25 mg by mouth daily.   Yes [provider]  lisinopril (ZESTRIL) 10 MG tablet Take 10 mg by mouth daily.   Yes [provider]  Multiple Vitamin (MULTIVITAMIN) tablet Take 1 tablet by mouth daily.   Yes [provider]  guaiFENesin (MUCINEX) 600 MG 12 hr tablet Take 600 mg by mouth 2 (two) times daily.    [provider]  pantoprazole (PROTONIX) 40 MG tablet Take 1 tablet (40 mg total) by mouth 2 (two) times daily. 04/15/19 05/15/19  Gregor Hams, MD    Allergies Patient has no known allergies.  Family History    Problem Relation Age of Onset  . Hypertension Mother     Social History Social History   Tobacco Use  . Smoking status: Never Smoker  . Smokeless tobacco: Never Used  Substance Use Topics  . Alcohol use: No  . Drug use: No    Review of Systems Constitutional: No fever/chills Eyes: No visual changes. ENT: No sore throat. Cardiovascular: Denies chest pain. Respiratory: Denies shortness of breath. Gastrointestinal: Positive for epigastric pain and vomiting Genitourinary: Negative for dysuria. Musculoskeletal: Negative for neck pain.  Negative for back pain. Integumentary: Negative for rash. Neurological: Negative for headaches, focal weakness or numbness.   ____________________________________________   PHYSICAL EXAM:  VITAL SIGNS: ED Triage Vitals  Enc Vitals Group     BP 04/15/19 1334 (!) 165/87     Pulse Rate 04/15/19 1334 78     Resp 04/15/19 1334 18     Temp 04/15/19 1334 99 F (37.2 C)     Temp Source 04/15/19 1334 Oral     SpO2 04/15/19 1334 98 %     Weight 04/15/19 1335 102.1 kg (225 lb)     Height 04/15/19 1335 1.6 m (5\' 3" )     Head Circumference --      Peak Flow --      Pain Score 04/15/19 1335 0     Pain Loc --      Pain Edu? --      Excl. in [place]? --     Constitutional: Alert and  oriented.  Eyes: Conjunctivae are normal.  Mouth/Throat: Patient is wearing a mask. Neck: No stridor.  No meningeal signs.   Cardiovascular: Normal rate, regular rhythm. Good peripheral circulation. Grossly normal heart sounds. Respiratory: Normal respiratory effort.  No retractions. Gastrointestinal: Right upper quadrant/epigastric tenderness to palpation. Musculoskeletal: No lower extremity tenderness nor edema. No gross deformities of extremities. Neurologic:  Normal speech and language. No gross focal neurologic deficits are appreciated.  Skin:  Skin is warm, dry and intact. Psychiatric: Mood and affect are normal. Speech and behavior are  normal. ____________________________________________   LABS (all labs ordered are listed, but only abnormal results are displayed)  Labs Reviewed  COMPREHENSIVE METABOLIC PANEL - Abnormal; Notable for the following components:      Result Value   Creatinine, Ser 1.08 (*)    GFR calc non Af Amer 51 (*)    GFR calc Af Amer 59 (*)    All other components within normal limits  LIPASE, BLOOD  CBC  URINALYSIS, COMPLETE (UACMP) WITH MICROSCOPIC   _______  RADIOLOGY I, [place] Deaja Rizo, personally viewed and evaluated these images (plain radiographs) as part of my medical decision making, as well as reviewing the written report by the radiologist.  ED MD interpretation: Normal right upper quadrant ultrasound per radiologist with exception of hepatic steatosis.\ CT abdomen pelvis revealed small hiatal hernia and diverticulosis and a fat-containing umbilical hernia however no other acute findings.  Official radiology report(s): CT ABDOMEN PELVIS W CONTRAST  Result Date: 04/15/2019 CLINICAL DATA:  Upper abdominal pain for several weeks. Nausea and vomiting. EXAM: CT ABDOMEN AND PELVIS WITH CONTRAST TECHNIQUE: Multidetector CT imaging of the abdomen and pelvis was performed using the standard protocol following bolus administration of intravenous contrast. CONTRAST:  100 mL OMNIPAQUE IOHEXOL 300 MG/ML  SOLN COMPARISON:  None. FINDINGS: Lower chest: Lung bases clear.  No pleural or pericardial effusion. Hepatobiliary: No focal liver abnormality is seen. No gallstones, gallbladder wall thickening, or biliary dilatation. Pancreas: Unremarkable. No pancreatic ductal dilatation or surrounding inflammatory changes. Spleen: Normal in size without focal abnormality. Adrenals/Urinary Tract: Adrenal glands are unremarkable. Kidneys are normal, without renal calculi, focal lesion, or hydronephrosis. Bladder is unremarkable. Stomach/Bowel: Small hiatal hernia. The stomach is otherwise unremarkable. Appendix  appears normal. No evidence of bowel wall thickening, distention, or inflammatory changes. Scattered diverticulosis without diverticulitis is seen. Vascular/Lymphatic: Aortic atherosclerosis. No enlarged abdominal or pelvic lymph nodes. Reproductive: Status post hysterectomy. No adnexal masses. Other: Small fat containing umbilical hernia is identified. Musculoskeletal: No acute or focal abnormality. IMPRESSION: No acute abnormality or finding to explain the patient's symptoms. Small hiatal hernia. Diverticulosis without diverticulitis. Atherosclerosis. Small fat containing umbilical hernia. Electronically Signed   By: Inge Rise M.D.   On: 04/15/2019 15:36   US ABDOMEN LIMITED RUQ  Result Date: 04/15/2019 CLINICAL DATA:  Right upper quadrant pain. EXAM: ULTRASOUND ABDOMEN LIMITED RIGHT UPPER QUADRANT COMPARISON:  None. FINDINGS: Gallbladder: Partially distended. No gallstones or wall thickening visualized. No sonographic Murphy sign noted by sonographer. Common bile duct: Diameter: 3 mm, normal. Liver: No focal lesion identified. Mild diffused increase in parenchymal echogenicity. Portal vein is patent on color Doppler imaging with normal direction of blood flow towards the liver. Other: No right upper quadrant free fluid. IMPRESSION: 1. Normal sonographic appearance of the gallbladder and biliary tree. No gallstones. 2. Borderline hepatic steatosis. Electronically Signed   By: Keith Rake M.D.   On: 04/15/2019 14:57      Procedures   ____________________________________________   INITIAL IMPRESSION /  MDM / ASSESSMENT AND PLAN / ED COURSE  As part of my medical decision making, I reviewed the following data within the electronic MEDICAL RECORD NUMBER   74 year old female presented with above-stated history and physical exam secondary to abdominal pain with differential diagnosis including but not limited to cholelithiasis versus other potential gallbladder disease peptic/gastric ulcer and  less likely diverticulitis.  Ultrasound revealed no acute findings of the right upper quadrant and subsequently CT scan was performed revealed evidence of a hiatal hernia and diverticulosis however no other clinical findings.  Patient given Protonix in the emergency department will be prescribed the same for home.  Patient discussed with Dr. Alice Reichert gastroenterologist on-call who will see the patient in clinic within the next 2 days.  Patient will be prescribed Protonix for home twice daily as recommended by Dr. Alice Reichert  ____________________________________________  FINAL CLINICAL IMPRESSION(S) / ED DIAGNOSES  Final diagnoses:  RUQ pain     MEDICATIONS GIVEN DURING THIS VISIT:  Medications  sodium chloride flush (NS) 0.9 % injection 3 mL (3 mLs Intravenous Not Given 04/15/19 1346)  iohexol (OMNIPAQUE) 300 MG/ML solution 100 mL (100 mLs Intravenous Contrast Given 04/15/19 1523)  pantoprazole (PROTONIX) EC tablet 40 mg (40 mg Oral Given 04/15/19 1630)     ED Discharge Orders         Ordered    pantoprazole (PROTONIX) 40 MG tablet  2 times daily     04/15/19 1628          *Please note:  Dawn Gonzalez was evaluated in Emergency Department on 04/15/2019 for the symptoms described in the history of present illness. She was evaluated in the context of the global COVID-19 pandemic, which necessitated consideration that the patient might be at risk for infection with the SARS-CoV-2 virus that causes COVID-19. Institutional protocols and algorithms that pertain to the evaluation of patients at risk for COVID-19 are in a state of rapid change based on information released by regulatory bodies including the CDC and federal and state organizations. These policies and algorithms were followed during the patient's care in the ED.  Some ED evaluations and interventions may be delayed as a result of limited staffing during the pandemic.*  Note:  This document was prepared using Dragon voice recognition  software and may include unintentional dictation errors.   Gregor Hams, MD 04/15/19 2115

## 2019-04-22 DIAGNOSIS — Z1211 Encounter for screening for malignant neoplasm of colon: Secondary | ICD-10-CM | POA: Diagnosis not present

## 2019-04-22 DIAGNOSIS — R1013 Epigastric pain: Secondary | ICD-10-CM | POA: Diagnosis not present

## 2019-04-22 DIAGNOSIS — R1031 Right lower quadrant pain: Secondary | ICD-10-CM | POA: Diagnosis not present

## 2019-04-22 DIAGNOSIS — R110 Nausea: Secondary | ICD-10-CM | POA: Diagnosis not present

## 2019-04-22 DIAGNOSIS — R1032 Left lower quadrant pain: Secondary | ICD-10-CM | POA: Diagnosis not present

## 2019-04-22 DIAGNOSIS — R197 Diarrhea, unspecified: Secondary | ICD-10-CM | POA: Diagnosis not present

## 2019-04-22 DIAGNOSIS — D509 Iron deficiency anemia, unspecified: Secondary | ICD-10-CM | POA: Diagnosis not present

## 2019-08-10 ENCOUNTER — Other Ambulatory Visit: Admission: RE | Admit: 2019-08-10 | Payer: PPO | Source: Ambulatory Visit

## 2019-08-11 ENCOUNTER — Other Ambulatory Visit
Admission: RE | Admit: 2019-08-11 | Discharge: 2019-08-11 | Disposition: A | Discharge status: Home / Self Care | Payer: PPO | Source: Ambulatory Visit | Attending: General Surgery | Admitting: General Surgery

## 2019-08-11 ENCOUNTER — Encounter: Payer: Self-pay | Admitting: General Surgery

## 2019-08-11 ENCOUNTER — Other Ambulatory Visit: Payer: Self-pay

## 2019-08-11 DIAGNOSIS — Z20822 Contact with and (suspected) exposure to covid-19: Secondary | ICD-10-CM | POA: Insufficient documentation

## 2019-08-11 DIAGNOSIS — Z01812 Encounter for preprocedural laboratory examination: Secondary | ICD-10-CM | POA: Diagnosis not present

## 2019-08-12 ENCOUNTER — Encounter: Payer: Self-pay | Admitting: General Surgery

## 2019-08-12 ENCOUNTER — Ambulatory Visit: Payer: PPO | Admitting: Certified Registered Nurse Anesthetist

## 2019-08-12 ENCOUNTER — Ambulatory Visit
Admission: RE | Admit: 2019-08-12 | Discharge: 2019-08-12 | Disposition: A | Discharge status: Home / Self Care | Payer: PPO | Attending: General Surgery | Admitting: General Surgery

## 2019-08-12 ENCOUNTER — Encounter
Admission: RE | Disposition: A | Discharge status: Home / Self Care | Payer: Self-pay | Source: Home / Self Care | Attending: General Surgery

## 2019-08-12 DIAGNOSIS — Z79899 Other long term (current) drug therapy: Secondary | ICD-10-CM | POA: Diagnosis not present

## 2019-08-12 DIAGNOSIS — K317 Polyp of stomach and duodenum: Secondary | ICD-10-CM | POA: Insufficient documentation

## 2019-08-12 DIAGNOSIS — I10 Essential (primary) hypertension: Secondary | ICD-10-CM | POA: Diagnosis not present

## 2019-08-12 DIAGNOSIS — K648 Other hemorrhoids: Secondary | ICD-10-CM | POA: Insufficient documentation

## 2019-08-12 DIAGNOSIS — D509 Iron deficiency anemia, unspecified: Secondary | ICD-10-CM | POA: Diagnosis not present

## 2019-08-12 DIAGNOSIS — D125 Benign neoplasm of sigmoid colon: Secondary | ICD-10-CM | POA: Insufficient documentation

## 2019-08-12 DIAGNOSIS — K219 Gastro-esophageal reflux disease without esophagitis: Secondary | ICD-10-CM | POA: Diagnosis not present

## 2019-08-12 DIAGNOSIS — K449 Diaphragmatic hernia without obstruction or gangrene: Secondary | ICD-10-CM | POA: Insufficient documentation

## 2019-08-12 DIAGNOSIS — K635 Polyp of colon: Secondary | ICD-10-CM | POA: Diagnosis not present

## 2019-08-12 DIAGNOSIS — Z1211 Encounter for screening for malignant neoplasm of colon: Secondary | ICD-10-CM | POA: Diagnosis not present

## 2019-08-12 DIAGNOSIS — K649 Unspecified hemorrhoids: Secondary | ICD-10-CM | POA: Diagnosis not present

## 2019-08-12 DIAGNOSIS — R1310 Dysphagia, unspecified: Secondary | ICD-10-CM | POA: Diagnosis present

## 2019-08-12 HISTORY — DX: Unspecified abdominal pain: R10.9

## 2019-08-12 HISTORY — DX: Diarrhea, unspecified: R19.7

## 2019-08-12 HISTORY — DX: Other specified abnormalities of plasma proteins: R77.8

## 2019-08-12 HISTORY — DX: Nonrheumatic mitral (valve) insufficiency: I34.0

## 2019-08-12 HISTORY — DX: Dyspnea, unspecified: R06.00

## 2019-08-12 HISTORY — DX: Personal history of other specified conditions: Z87.898

## 2019-08-12 HISTORY — PX: ESOPHAGOGASTRODUODENOSCOPY (EGD) WITH PROPOFOL: SHX5813

## 2019-08-12 HISTORY — DX: Other specified abnormal findings of blood chemistry: R79.89

## 2019-08-12 HISTORY — DX: Anemia, unspecified: D64.9

## 2019-08-12 HISTORY — PX: COLONOSCOPY WITH PROPOFOL: SHX5780

## 2019-08-12 LAB — SARS CORONAVIRUS 2 (TAT 6-24 HRS): SARS Coronavirus 2: NEGATIVE

## 2019-08-12 SURGERY — ESOPHAGOGASTRODUODENOSCOPY (EGD) WITH PROPOFOL
Anesthesia: General

## 2019-08-12 MED ORDER — SODIUM CHLORIDE 0.9 % IV SOLN: INTRAVENOUS | Status: DC

## 2019-08-12 MED ORDER — PROPOFOL 500 MG/50ML IV EMUL
INTRAVENOUS | Status: DC | PRN
  Administered 2019-08-12: 160 ug/kg/min via INTRAVENOUS

## 2019-08-12 MED ORDER — LIDOCAINE HCL (CARDIAC) PF 100 MG/5ML IV SOSY
PREFILLED_SYRINGE | INTRAVENOUS | Status: DC | PRN
  Administered 2019-08-12: 100 mg via INTRAVENOUS

## 2019-08-12 MED ORDER — PROPOFOL 10 MG/ML IV BOLUS
INTRAVENOUS | Status: DC | PRN
  Administered 2019-08-12: 80 mg via INTRAVENOUS

## 2019-08-12 NOTE — Transfer of Care (Signed)
Immediate Anesthesia Transfer of Care Note  Patient: Dawn Gonzalez  Procedure(s) Performed: ESOPHAGOGASTRODUODENOSCOPY (EGD) WITH PROPOFOL (N/A ) COLONOSCOPY WITH PROPOFOL (N/A )  Patient Location: PACU  Anesthesia Type:General  Level of Consciousness: drowsy  Airway & Oxygen Therapy: Patient Spontanous Breathing and Patient connected to nasal cannula oxygen  Post-op Assessment: Report given to RN and Post -op Vital signs reviewed and stable  Post vital signs: Reviewed and stable  Last Vitals:  Vitals Value Taken Time  BP 100/52 08/12/19 1234  Temp 36.4 C 08/12/19 1234  Pulse 66 08/12/19 1235  Resp 15 08/12/19 1235  SpO2 95 % 08/12/19 1235  Vitals shown include unvalidated device data.  Last Pain:  Vitals:   08/12/19 1234  TempSrc: Temporal  PainSc:          Complications: No complications documented.

## 2019-08-12 NOTE — H&P (Signed)
Dawn Gonzalez [id number] 1945-04-18     HPI:   74 y/o woman with history of epigastric pain, recent episode of diarrhea.  Iron deficiency anemia in 2018 responded to iron supplements.  Evaluation for possible bleeding postponed by her husband (of 62 years) diagnosis of pancreatic cancer. He passed 14 months ago.  She tolerated the prep with some nausea, but no vomiting.   Medications Prior to Admission  Medication Sig Dispense Refill Last Dose  . esomeprazole (NEXIUM) 20 MG capsule Take 20 mg by mouth daily at 12 noon.   08/11/2019 at Unknown time  . hydrochlorothiazide (HYDRODIURIL) 25 MG tablet Take 25 mg by mouth daily.   08/11/2019 at Unknown time  . lisinopril (ZESTRIL) 10 MG tablet Take 10 mg by mouth daily.   08/11/2019 at Unknown time  . Multiple Vitamin (MULTIVITAMIN) tablet Take 1 tablet by mouth daily.   Past Week at Unknown time  . calcium-vitamin D (OSCAL WITH D) 500-200 MG-UNIT tablet Take 1 tablet by mouth 3 (three) times a week. Takes three times a week (Patient not taking: Reported on 08/12/2019)   Not Taking at Unknown time  . guaiFENesin (MUCINEX) 600 MG 12 hr tablet Take 600 mg by mouth 2 (two) times daily.     . pantoprazole (PROTONIX) 40 MG tablet Take 1 tablet (40 mg total) by mouth 2 (two) times daily. 30 tablet 1    No Known Allergies Past Medical History:  Diagnosis Date  . Abdominal pain   . Acute diarrhea   . Anemia   . Dyspnea   . Elevated troponin   . History of syncope   . Hypertension   . Nonrheumatic mitral valve regurgitation    Past Surgical History:  Procedure Laterality Date  . ABDOMINAL HYSTERECTOMY     Social History   Socioeconomic History  . Marital status: Married    Spouse name: Not on file  . Number of children: Not on file  . Years of education: Not on file  . Highest education level: Not on file  Occupational History  . Not on file  Tobacco Use  . Smoking status: Never Smoker  . Smokeless tobacco: Never Used  Substance and  Sexual Activity  . Alcohol use: No  . Drug use: No  . Sexual activity: Not on file  Other Topics Concern  . Not on file  Social History Narrative  . Not on file   Social Determinants of Health   Financial Resource Strain:   . Difficulty of Paying Living Expenses:   Food Insecurity:   . Worried About Charity fundraiser in the Last Year:   . Arboriculturist in the Last Year:   Transportation Needs:   . Film/video editor (Medical):   Marland Kitchen Lack of Transportation (Non-Medical):   Physical Activity:   . Days of Exercise per Week:   . Minutes of Exercise per Session:   Stress:   . Feeling of Stress :   Social Connections:   . Frequency of Communication with Friends and Family:   . Frequency of Social Gatherings with Friends and Family:   . Attends Religious Services:   . Active Member of Clubs or Organizations:   . Attends Archivist Meetings:   Marland Kitchen Marital Status:   Intimate Partner Violence:   . Fear of Current or Ex-Partner:   . Emotionally Abused:   Marland Kitchen Physically Abused:   . Sexually Abused:    Social History   Social History  Narrative  . Not on file     ROS: Negative.     PE: HEENT: Negative. Lungs: Clear. Cardio: RR.  Assessment/Plan:  Proceed with planned endoscopy.   Forest Gleason [facility] 08/12/2019

## 2019-08-12 NOTE — Anesthesia Postprocedure Evaluation (Signed)
Anesthesia Post Note  Patient: Dawn Gonzalez  Procedure(s) Performed: ESOPHAGOGASTRODUODENOSCOPY (EGD) WITH PROPOFOL (N/A ) COLONOSCOPY WITH PROPOFOL (N/A )  Patient location during evaluation: Endoscopy Anesthesia Type: General Level of consciousness: awake and alert Pain management: pain level controlled Vital Signs Assessment: post-procedure vital signs reviewed and stable Respiratory status: spontaneous breathing and respiratory function stable Cardiovascular status: stable Anesthetic complications: no   No complications documented.   Last Vitals:  Vitals:   08/12/19 1234 08/12/19 1244  BP: (!) 100/52 111/65  Pulse:    Resp:    Temp: (!) 36.4 C   SpO2:      Last Pain:  Vitals:   08/12/19 1244  TempSrc:   PainSc: 0-No pain                 Yeriel Mineo K

## 2019-08-12 NOTE — Op Note (Signed)
[facility] Gastroenterology Patient Name: Dawn Gonzalez Procedure Date: 08/12/2019 11:42 AM MRN: [id number] Account #: [id number] Date of Birth: July 10, 1945 Admit Type: Outpatient Age: 74 Room: [facility] ENDO ROOM 1 Gender: Female Note Status: Finalized Procedure:             Upper GI endoscopy Indications:           Epigastric abdominal pain, Iron deficiency anemia Providers:             Robert Bellow, MD Medicines:             Monitored Anesthesia Care Complications:         No immediate complications. Procedure:             Pre-Anesthesia Assessment:                        - Prior to the procedure, a History and Physical was                         performed, and patient medications, allergies and                         sensitivities were reviewed. The patient's tolerance                         of previous anesthesia was reviewed.                        - The risks and benefits of the procedure and the                         sedation options and risks were discussed with the                         patient. All questions were answered and informed                         consent was obtained.                        After obtaining informed consent, the endoscope was                         passed under direct vision. Throughout the procedure,                         the patient's blood pressure, pulse, and oxygen                         saturations were monitored continuously. The Endoscope                         was introduced through the mouth, and advanced to the                         second part of duodenum. The upper GI endoscopy was                         accomplished without difficulty. The patient tolerated  the procedure well. Findings:      A medium-sized hiatal hernia was present.      A single 6 mm sessile polyp with no bleeding and no stigmata of recent       bleeding was found in the gastric body. Biopsies were taken  with a cold       forceps for histology.      The examined duodenum was normal. Impression:            - Medium-sized hiatal hernia.                        - A single gastric polyp. Biopsied.                        - Normal examined duodenum. Recommendation:        - Telephone endoscopist for pathology results in 1                         week. Robert Bellow, MD 08/12/2019 12:07:58 PM This report has been signed electronically. Number of Addenda: 0 Note Initiated On: 08/12/2019 11:42 AM      [facility]

## 2019-08-12 NOTE — Op Note (Signed)
[facility] Gastroenterology Patient Name: Dawn Gonzalez Procedure Date: 08/12/2019 11:42 AM MRN: [id number] Account #: [id number] Date of Birth: 04-11-1945 Admit Type: Outpatient Age: 74 Room: [facility] ENDO ROOM 1 Gender: Female Note Status: Finalized Procedure:             Colonoscopy Indications:           Screening for colorectal malignant neoplasm Providers:             Robert Bellow, MD Medicines:             Monitored Anesthesia Care Complications:         No immediate complications. Procedure:             Pre-Anesthesia Assessment:                        - Prior to the procedure, a History and Physical was                         performed, and patient medications, allergies and                         sensitivities were reviewed. The patient's tolerance                         of previous anesthesia was reviewed.                        - The risks and benefits of the procedure and the                         sedation options and risks were discussed with the                         patient. All questions were answered and informed                         consent was obtained.                        After obtaining informed consent, the colonoscope was                         passed under direct vision. Throughout the procedure,                         the patient's blood pressure, pulse, and oxygen                         saturations were monitored continuously. The                         Colonoscope was introduced through the anus and                         advanced to the the cecum, identified by appendiceal                         orifice and ileocecal valve. The colonoscopy was  somewhat difficult due to a tortuous colon. Successful                         completion of the procedure was aided by using manual                         pressure. The patient tolerated the procedure well.                         The quality of the  bowel preparation was excellent. Findings:      A 12 mm polyp was found in the sigmoid colon. The polyp was sessile. The       polyp was removed with a hot snare. Resection and retrieval were       complete.      The retroflexed view of the distal rectum and anal verge was normal and       showed no anal or rectal abnormalities.      Hemorrhoids were found on withdrawing the scope. Internal, no bleeding. Impression:            - One 12 mm polyp in the sigmoid colon, removed with a                         hot snare. Resected and retrieved.                        - The distal rectum and anal verge are normal on                         retroflexion view. Recommendation:        - Telephone endoscopist for pathology results in 1                         week. Procedure Code(s):     --- Professional ---                        [id number], Colonoscopy, flexible; with removal of                         tumor(s), polyp(s), or other lesion(s) by snare                         technique Diagnosis Code(s):     --- Professional ---                        Z12.11, Encounter for screening for malignant neoplasm                         of colon                        K63.5, Polyp of colon CPT copyright 2019 American Medical Association. All rights reserved. The codes documented in this report are preliminary and upon coder review may  be revised to meet current compliance requirements. Robert Bellow, MD 08/12/2019 12:33:08 PM This report has been signed electronically. Number of Addenda: 0 Note Initiated On: 08/12/2019 11:42 AM Scope Withdrawal Time: 0 hours 12 minutes 38 seconds  Total Procedure Duration:  0 hours 20 minutes 3 seconds       [facility]

## 2019-08-12 NOTE — Anesthesia Preprocedure Evaluation (Signed)
Anesthesia Evaluation  Patient identified by MRN, date of birth, ID band Patient awake    Reviewed: Allergy & Precautions, NPO status , Patient's Chart, lab work & pertinent test results  History of Anesthesia Complications Negative for: history of anesthetic complications  Airway Mallampati: II       Dental   Pulmonary neg sleep apnea, neg COPD, Not current smoker,           Cardiovascular hypertension, Pt. on medications (-) Past MI and (-) CHF (-) dysrhythmias (-) Valvular Problems/Murmurs     Neuro/Psych neg Seizures    GI/Hepatic Neg liver ROS, GERD  Medicated and Controlled,  Endo/Other  neg diabetes  Renal/GU negative Renal ROS     Musculoskeletal   Abdominal   Peds  Hematology  (+) anemia ,   Anesthesia Other Findings   Reproductive/Obstetrics                             Anesthesia Physical Anesthesia Plan  ASA: II  Anesthesia Plan: General   Post-op Pain Management:    Induction: Intravenous  PONV Risk Score and Plan: 3 and Propofol infusion, TIVA and Treatment may vary due to age or medical condition  Airway Management Planned: Nasal Cannula  Additional Equipment:   Intra-op Plan:   Post-operative Plan:   Informed Consent: I have reviewed the patients History and Physical, chart, labs and discussed the procedure including the risks, benefits and alternatives for the proposed anesthesia with the patient or authorized representative who has indicated his/her understanding and acceptance.       Plan Discussed with:   Anesthesia Plan Comments:         Anesthesia Quick Evaluation

## 2019-08-13 ENCOUNTER — Encounter: Payer: Self-pay | Admitting: General Surgery

## 2019-08-13 LAB — SURGICAL PATHOLOGY

## 2019-08-14 ENCOUNTER — Telehealth: Payer: Self-pay | Admitting: Physician Assistant

## 2019-08-14 NOTE — Telephone Encounter (Signed)
Patient called and notified of potential risk of contamination of the [place]'s water supply during recent procedure at [facility]. Patient advised to contact PCP if symptoms develop. Pt verbalized understanding. No other concerns voiced at this time.

## 2020-02-22 DIAGNOSIS — M25561 Pain in right knee: Secondary | ICD-10-CM | POA: Diagnosis not present

## 2020-02-22 DIAGNOSIS — M1711 Unilateral primary osteoarthritis, right knee: Secondary | ICD-10-CM | POA: Diagnosis not present

## 2020-07-05 ENCOUNTER — Other Ambulatory Visit
Admission: RE | Admit: 2020-07-05 | Discharge: 2020-07-05 | Disposition: A | Discharge status: Home / Self Care | Payer: PPO | Source: Ambulatory Visit | Attending: Physician Assistant | Admitting: Physician Assistant

## 2020-07-05 DIAGNOSIS — R609 Edema, unspecified: Secondary | ICD-10-CM | POA: Diagnosis not present

## 2020-07-05 DIAGNOSIS — I361 Nonrheumatic tricuspid (valve) insufficiency: Secondary | ICD-10-CM | POA: Diagnosis not present

## 2020-07-05 DIAGNOSIS — I10 Essential (primary) hypertension: Secondary | ICD-10-CM | POA: Diagnosis not present

## 2020-07-05 DIAGNOSIS — I340 Nonrheumatic mitral (valve) insufficiency: Secondary | ICD-10-CM | POA: Diagnosis not present

## 2020-07-05 DIAGNOSIS — R0600 Dyspnea, unspecified: Secondary | ICD-10-CM | POA: Insufficient documentation

## 2020-07-05 DIAGNOSIS — R0602 Shortness of breath: Secondary | ICD-10-CM | POA: Diagnosis not present

## 2020-07-05 LAB — BRAIN NATRIURETIC PEPTIDE: B Natriuretic Peptide: 142.2 pg/mL — ABNORMAL HIGH (ref 0.0–100.0)

## 2020-08-11 DIAGNOSIS — I340 Nonrheumatic mitral (valve) insufficiency: Secondary | ICD-10-CM | POA: Diagnosis not present

## 2020-08-11 DIAGNOSIS — I361 Nonrheumatic tricuspid (valve) insufficiency: Secondary | ICD-10-CM | POA: Diagnosis not present

## 2020-08-11 DIAGNOSIS — R0602 Shortness of breath: Secondary | ICD-10-CM | POA: Diagnosis not present

## 2020-08-11 DIAGNOSIS — R0600 Dyspnea, unspecified: Secondary | ICD-10-CM | POA: Diagnosis not present

## 2020-08-15 DIAGNOSIS — I10 Essential (primary) hypertension: Secondary | ICD-10-CM | POA: Diagnosis not present

## 2020-08-15 DIAGNOSIS — I340 Nonrheumatic mitral (valve) insufficiency: Secondary | ICD-10-CM | POA: Diagnosis not present

## 2020-08-15 DIAGNOSIS — I361 Nonrheumatic tricuspid (valve) insufficiency: Secondary | ICD-10-CM | POA: Diagnosis not present

## 2020-08-15 DIAGNOSIS — R0602 Shortness of breath: Secondary | ICD-10-CM | POA: Diagnosis not present

## 2021-05-21 IMAGING — US US ABDOMEN LIMITED
1 series · 14 of 25 positions shown · non-contrast
Comparison: None.

CLINICAL DATA: Right upper quadrant pain.

EXAM:
ULTRASOUND ABDOMEN LIMITED RIGHT UPPER QUADRANT

[Series 1: us abdomen limited ruq · 14 of 42 slices shown]
[im 1/42]
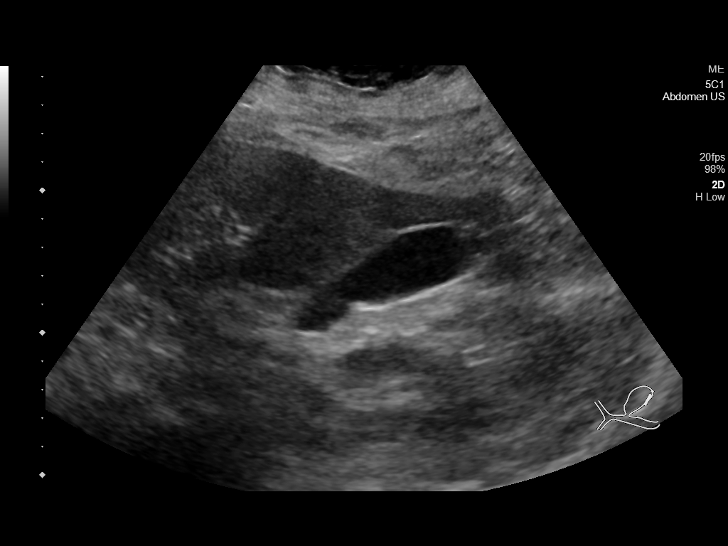
[im 4/42]
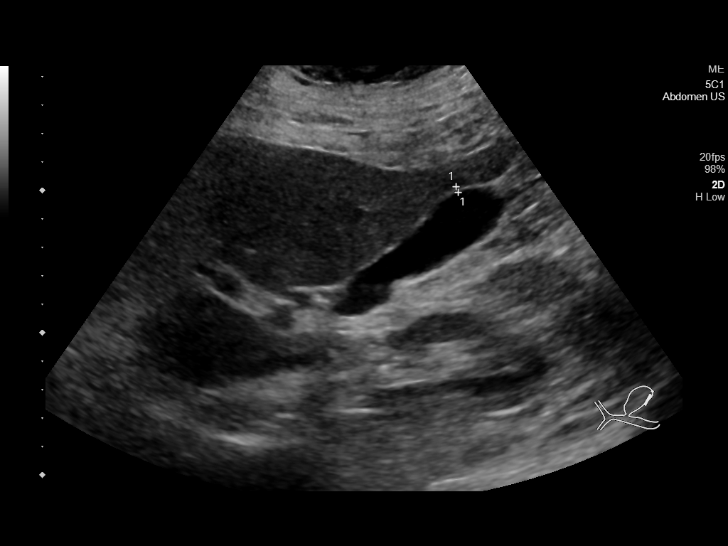
[im 7/42]
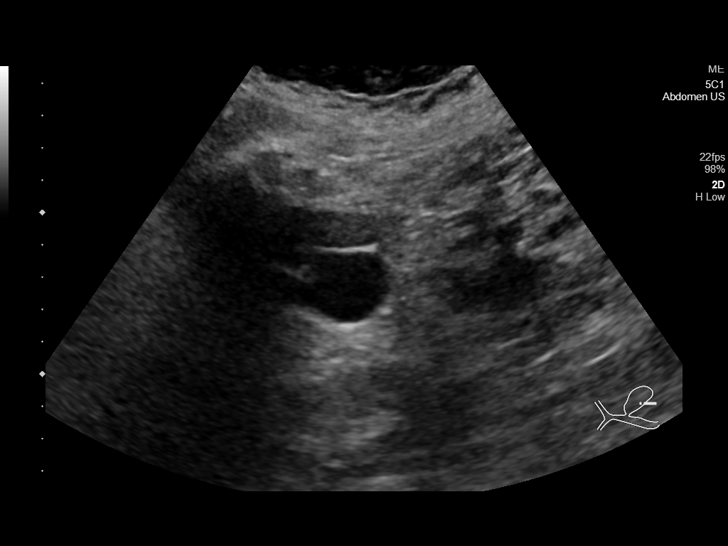
[im 11/42]
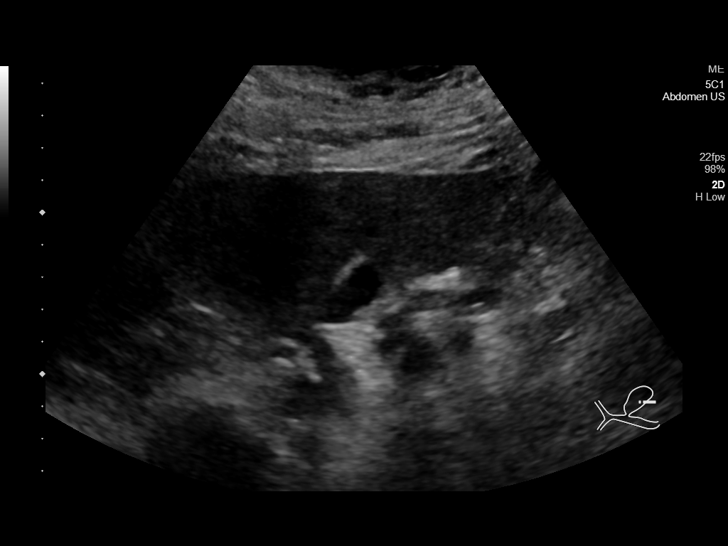
[im 14/42]
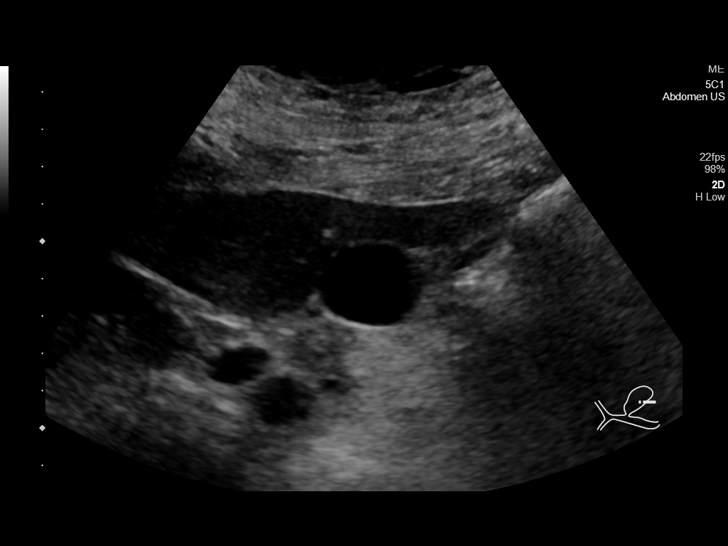
[im 16/42]
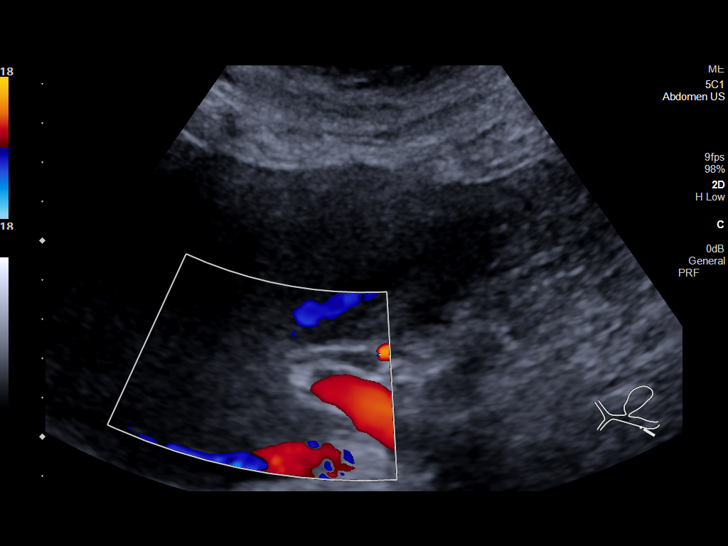
[im 19/42]
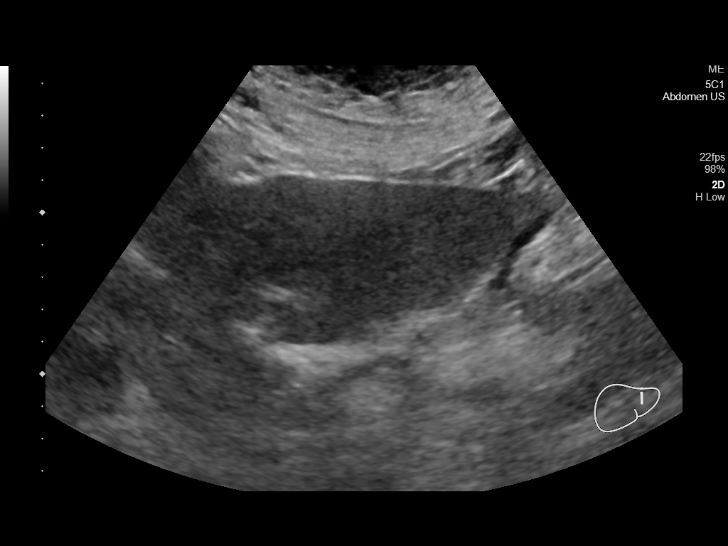
[im 23/42]
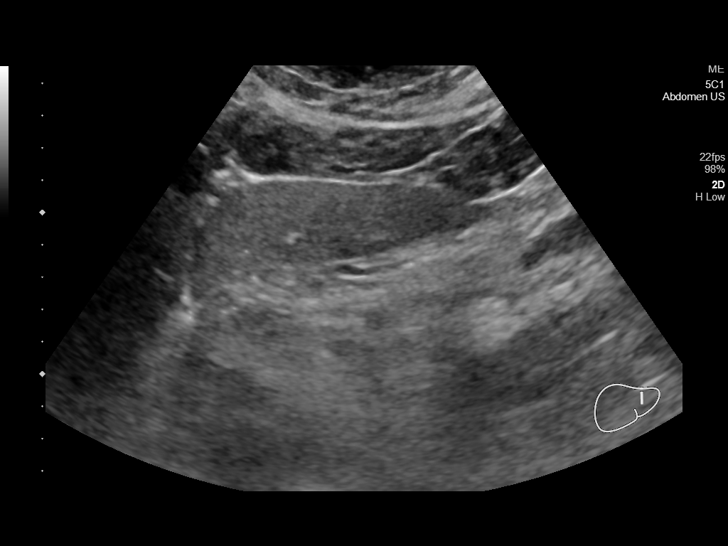
[im 26/42]
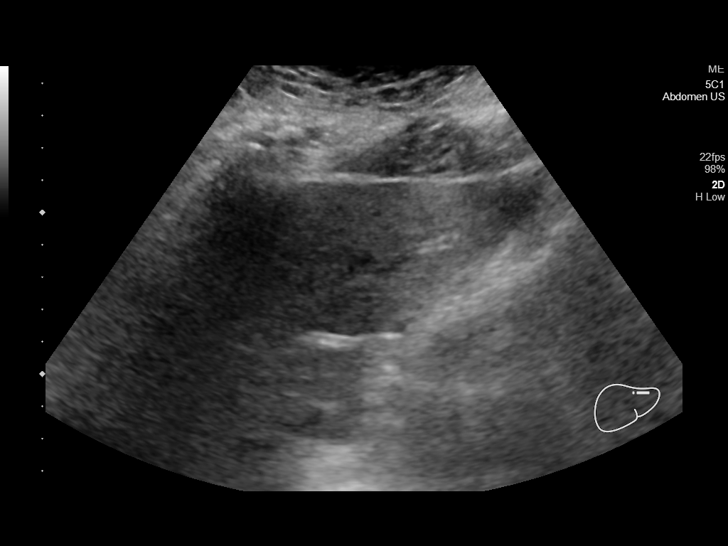
[im 28/42]
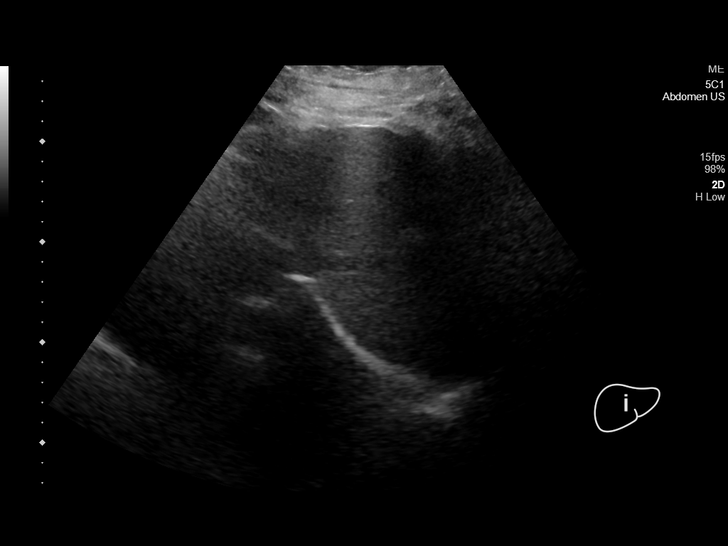
[im 31/42]
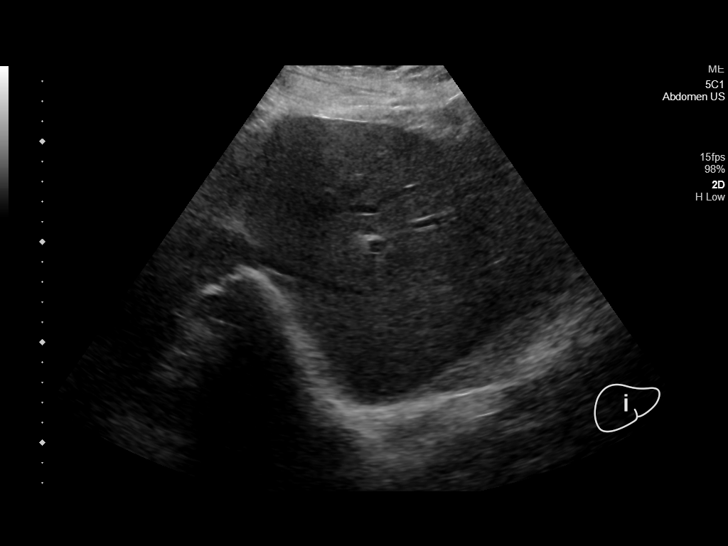
[im 35/42]
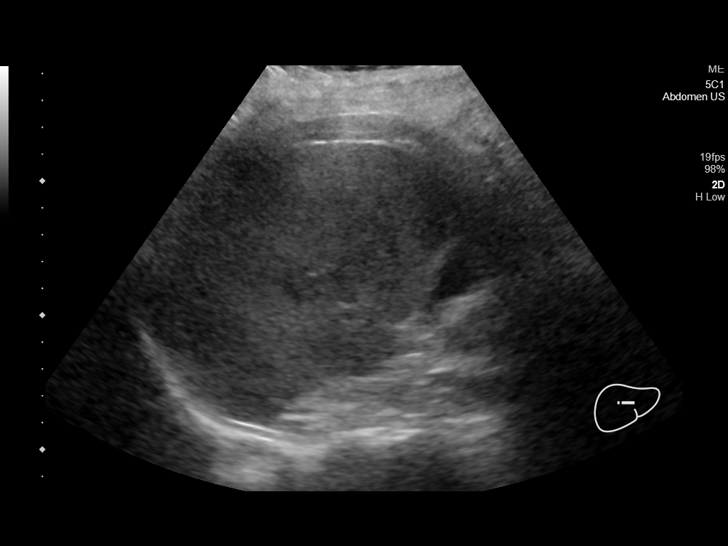
[im 38/42]
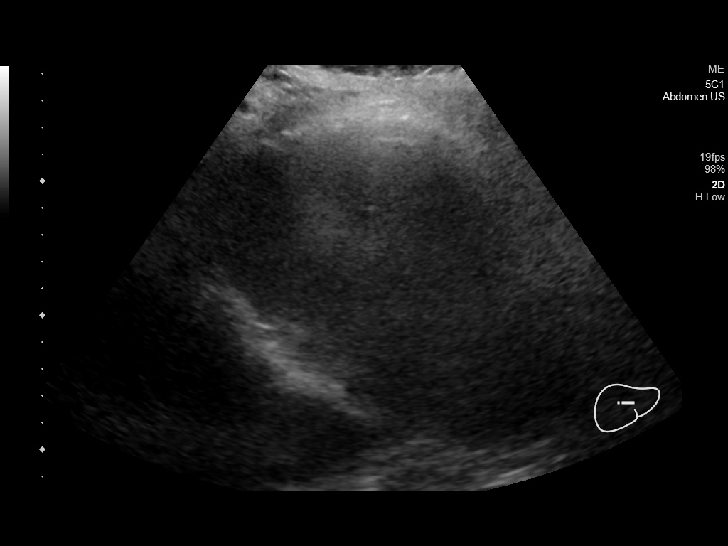
[im 42/42]
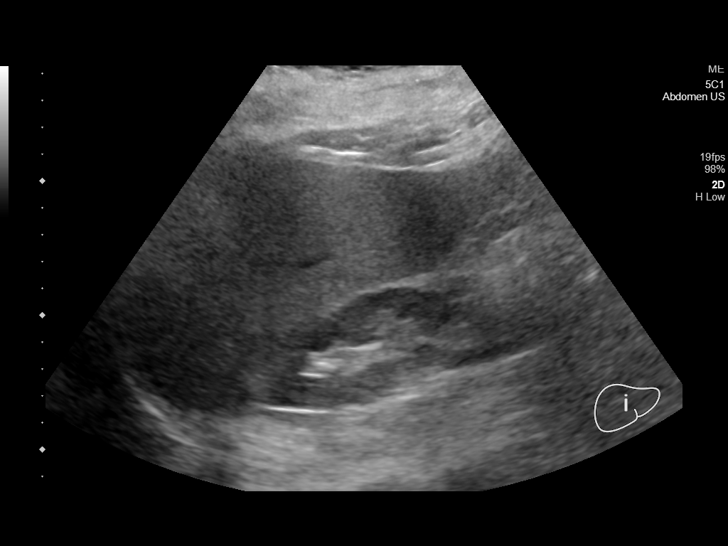

[14 of 25 positions shown; findings below may reference images not displayed]

FINDINGS: Gallbladder:

Partially distended. No gallstones or wall thickening visualized. No
sonographic Murphy sign noted by sonographer.

Common bile duct:

Diameter: 3 mm, normal.

Liver:

No focal lesion identified. Mild diffused increase in parenchymal
echogenicity. Portal vein is patent on color Doppler imaging with
normal direction of blood flow towards the liver.

Other: No right upper quadrant free fluid.
IMPRESSION: 1. Normal sonographic appearance of the gallbladder and biliary
tree. No gallstones.
2. Borderline hepatic steatosis.

## 2021-07-11 DIAGNOSIS — Z6841 Body Mass Index (BMI) 40.0 and over, adult: Secondary | ICD-10-CM | POA: Diagnosis not present

## 2021-07-11 DIAGNOSIS — K219 Gastro-esophageal reflux disease without esophagitis: Secondary | ICD-10-CM | POA: Diagnosis not present

## 2021-07-11 DIAGNOSIS — R011 Cardiac murmur, unspecified: Secondary | ICD-10-CM | POA: Diagnosis not present

## 2021-07-11 DIAGNOSIS — G8929 Other chronic pain: Secondary | ICD-10-CM | POA: Diagnosis not present

## 2021-07-11 DIAGNOSIS — M1990 Unspecified osteoarthritis, unspecified site: Secondary | ICD-10-CM | POA: Diagnosis not present

## 2021-07-11 DIAGNOSIS — I10 Essential (primary) hypertension: Secondary | ICD-10-CM | POA: Diagnosis not present

## 2021-07-27 DIAGNOSIS — J209 Acute bronchitis, unspecified: Secondary | ICD-10-CM | POA: Diagnosis not present

## 2021-11-06 DIAGNOSIS — N6019 Diffuse cystic mastopathy of unspecified breast: Secondary | ICD-10-CM | POA: Insufficient documentation

## 2021-11-06 DIAGNOSIS — E669 Obesity, unspecified: Secondary | ICD-10-CM | POA: Insufficient documentation

## 2021-11-06 DIAGNOSIS — G4700 Insomnia, unspecified: Secondary | ICD-10-CM | POA: Insufficient documentation

## 2021-11-06 DIAGNOSIS — K219 Gastro-esophageal reflux disease without esophagitis: Secondary | ICD-10-CM | POA: Insufficient documentation

## 2021-11-06 NOTE — Progress Notes (Unsigned)
   There were no vitals taken for this visit.   Subjective:    Patient ID: Dawn Gonzalez, female    DOB: 1945-04-29, 76 y.o.   MRN: [id number]  HPI: Dawn Gonzalez is a 76 y.o. female  No chief complaint on file.  Patient presents to clinic to establish care with new PCP.  Introduced to Designer, jewellery role and practice setting.  All questions answered.  Discussed provider/patient relationship and expectations.  Patient reports a history of ***. Patient denies a history of: Hypertension, Elevated Cholesterol, Diabetes, Thyroid problems, Depression, Anxiety, Neurological problems, and Abdominal problems.   Active Ambulatory Problems    Diagnosis Date Noted   Dizziness 03/02/2016   Anemia, iron deficiency 01/03/2017   Bloodgood disease 11/06/2021   Adiposity 11/06/2021   Acid reflux 11/06/2021   SOB (shortness of breath) on exertion 03/14/2016   Nonrheumatic tricuspid valve regurgitation 12/30/2017   Nonrheumatic mitral valve regurgitation 12/30/2017   Essential hypertension 03/14/2016   Cannot sleep 11/06/2021   Resolved Ambulatory Problems    Diagnosis Date Noted   No Resolved Ambulatory Problems   Past Medical History:  Diagnosis Date   Abdominal pain    Acute diarrhea    Anemia    Dyspnea    Elevated troponin    History of syncope    Hypertension    Past Surgical History:  Procedure Laterality Date   ABDOMINAL HYSTERECTOMY     COLONOSCOPY WITH PROPOFOL N/A 08/12/2019   Procedure: COLONOSCOPY WITH PROPOFOL;  Surgeon: Robert Bellow, MD;  Location: [facility];  Service: Endoscopy;  Laterality: N/A;   ESOPHAGOGASTRODUODENOSCOPY (EGD) WITH PROPOFOL N/A 08/12/2019   Procedure: ESOPHAGOGASTRODUODENOSCOPY (EGD) WITH PROPOFOL;  Surgeon: Robert Bellow, MD;  Location: [facility];  Service: Endoscopy;  Laterality: N/A;   Family History  Problem Relation Age of Onset   Hypertension Mother      Review of Systems  Per HPI unless specifically indicated  above     Objective:    There were no vitals taken for this visit.  Wt Readings from Last 3 Encounters:  08/12/19 226 lb (102.5 kg)  04/15/19 225 lb (102.1 kg)  03/02/16 210 lb (95.3 kg)    Physical Exam  Results for orders placed or performed during the hospital encounter of 07/05/20  Brain natriuretic peptide  Result Value Ref Range   B Natriuretic Peptide 142.2 (H) 0.0 - 100.0 pg/mL      Assessment & Plan:   Problem List Items Addressed This Visit   None Visit Diagnoses     Encounter to establish care    -  Primary        Follow up plan: No follow-ups on file.

## 2021-11-07 ENCOUNTER — Encounter: Payer: Self-pay | Admitting: Nurse Practitioner

## 2021-11-07 ENCOUNTER — Ambulatory Visit
Admission: RE | Admit: 2021-11-07 | Discharge: 2021-11-07 | Disposition: A | Discharge status: Home / Self Care | Payer: PPO | Source: Ambulatory Visit | Attending: Nurse Practitioner | Admitting: Nurse Practitioner

## 2021-11-07 ENCOUNTER — Ambulatory Visit (INDEPENDENT_AMBULATORY_CARE_PROVIDER_SITE_OTHER): Payer: PPO | Admitting: Nurse Practitioner

## 2021-11-07 ENCOUNTER — Ambulatory Visit
Admission: RE | Admit: 2021-11-07 | Discharge: 2021-11-07 | Disposition: A | Discharge status: Home / Self Care | Payer: PPO | Attending: Nurse Practitioner | Admitting: Nurse Practitioner

## 2021-11-07 VITALS — BP 173/91 | HR 85 | Temp 98.1°F | Ht 65.0 in | Wt 223.6 lb

## 2021-11-07 DIAGNOSIS — I10 Essential (primary) hypertension: Secondary | ICD-10-CM | POA: Diagnosis not present

## 2021-11-07 DIAGNOSIS — R0602 Shortness of breath: Secondary | ICD-10-CM | POA: Insufficient documentation

## 2021-11-07 DIAGNOSIS — J209 Acute bronchitis, unspecified: Secondary | ICD-10-CM | POA: Diagnosis not present

## 2021-11-07 DIAGNOSIS — Z7689 Persons encountering health services in other specified circumstances: Secondary | ICD-10-CM

## 2021-11-07 MED ORDER — PREDNISONE 10 MG PO TABS: 10.0000 mg | ORAL_TABLET | Freq: Every day | ORAL | 0 refills | Status: DC

## 2021-11-07 MED ORDER — AMOXICILLIN-POT CLAVULANATE 875-125 MG PO TABS: 1.0000 | ORAL_TABLET | Freq: Two times a day (BID) | ORAL | 0 refills | Status: AC

## 2021-11-07 NOTE — Progress Notes (Signed)
Please let patient know that her xray did show a pneumonia.  She should complete the prednisone and augmentin.  We will keep our appt for two weeks.  However, if symptoms are worsening, please let me know so she can be seen sooner.

## 2021-11-07 NOTE — Assessment & Plan Note (Signed)
Chronic. Not well controlled. Patient is not feeling well and taking Allegra D.  Will follow up in 2 weeks for reevaluation.  Can adjust medications at that time if needed.

## 2021-11-22 DIAGNOSIS — Z7189 Other specified counseling: Secondary | ICD-10-CM | POA: Insufficient documentation

## 2021-11-22 NOTE — Progress Notes (Deleted)
There were no vitals taken for this visit.   Subjective:    Patient ID: Dawn Gonzalez, female    DOB: 1945/10/24, 76 y.o.   MRN: [id number]  HPI: Dawn Gonzalez is a 76 y.o. female presenting on 11/23/2021 for comprehensive medical examination. Current medical complaints include:{Blank single:19197::"none","***"}  She currently lives with: Menopausal Symptoms: {Blank single:19197::"yes","no"}  HYPERTENSION {Blank single:19197::"without","with"} Chronic Kidney Disease Hypertension status: {Blank single:19197::"controlled","uncontrolled","better","worse","exacerbated","stable"}  Satisfied with current treatment? {Blank single:19197::"yes","no"} Duration of hypertension: {Blank single:19197::"chronic","months","years"} BP monitoring frequency:  {Blank single:19197::"not checking","rarely","daily","weekly","monthly","a few times a day","a few times a week","a few times a month"} BP range:  BP medication side effects:  {Blank single:19197::"yes","no"} Medication compliance: {Blank single:19197::"excellent compliance","good compliance","fair compliance","poor compliance"} Previous BP meds:{Blank CHYIFOYD:74128::"NOMV","EHMCNOBSJG","GEZMOQHUTM/LYYTKPTWSF","KCLEXNTZ","GYFVCBSWHQ","PRFFMBWGYK/ZLDJ","TTSVXBLTJQ (bystolic)","carvedilol","chlorthalidone","clonidine","diltiazem","exforge HCT","HCTZ","irbesartan (avapro)","labetalol","lisinopril","lisinopril-HCTZ","losartan (cozaar)","methyldopa","nifedipine","olmesartan (benicar)","olmesartan-HCTZ","quinapril","ramipril","spironalactone","tekturna","valsartan","valsartan-HCTZ","verapamil"} Aspirin: {Blank single:19197::"yes","no"} Recurrent headaches: {Blank single:19197::"yes","no"} Visual changes: {Blank single:19197::"yes","no"} Palpitations: {Blank single:19197::"yes","no"} Dyspnea: {Blank single:19197::"yes","no"} Chest pain: {Blank single:19197::"yes","no"} Lower extremity edema: {Blank single:19197::"yes","no"} Dizzy/lightheaded: {Blank  single:19197::"yes","no"}  ANEMIA Anemia status: {Blank single:19197::"controlled","uncontrolled","better","worse","exacerbated","stable"} Etiology of anemia: Duration of anemia treatment:  Compliance with treatment: {Blank single:19197::"excellent compliance","good compliance","fair compliance","poor compliance"} Iron supplementation side effects: {Blank single:19197::"yes","no"} Severity of anemia: {Blank single:19197::"mild","moderate","severe"} Fatigue: {Blank single:19197::"yes","no"} Decreased exercise tolerance: {Blank single:19197::"yes","no"}  Dyspnea on exertion: {Blank single:19197::"yes","no"} Palpitations: {Blank single:19197::"yes","no"} Bleeding: {Blank single:19197::"yes","no"} Pica: {Blank single:19197::"yes","no"}   Functional Status Survey:       08/29/2018   10:15 AM  Fall Risk   Falls in the past year? 0  Comment Emmi Telephone Survey: data to providers prior to load    Depression Screen     No data to display           Advanced Directives Does patient have a HCPOA?    {Blank single:19197::"yes","no"} If yes, name and contact information:  Does patient have a living will or MOST form?  {Blank single:19197::"yes","no"}  Past Medical History:  Past Medical History:  Diagnosis Date   Abdominal pain    Acute diarrhea    Allergy    Anemia    Dyspnea    Elevated troponin    History of syncope    Hypertension    Nonrheumatic mitral valve regurgitation     Surgical History:  Past Surgical History:  Procedure Laterality Date   ABDOMINAL HYSTERECTOMY     COLONOSCOPY WITH PROPOFOL N/A 08/12/2019   Procedure: COLONOSCOPY WITH PROPOFOL;  Surgeon: Robert Bellow, MD;  Location: [facility];  Service: Endoscopy;  Laterality: N/A;   ESOPHAGOGASTRODUODENOSCOPY (EGD) WITH PROPOFOL N/A 08/12/2019   Procedure: ESOPHAGOGASTRODUODENOSCOPY (EGD) WITH PROPOFOL;  Surgeon: Robert Bellow, MD;  Location: [facility];  Service: Endoscopy;   Laterality: N/A;    Medications:  Current Outpatient Medications on File Prior to Visit  Medication Sig   calcium-vitamin D (OSCAL WITH D) 500-200 MG-UNIT tablet Take 1 tablet by mouth 3 (three) times a week. Takes three times a week (Patient not taking: Reported on 08/12/2019)   esomeprazole (NEXIUM) 20 MG capsule Take 20 mg by mouth daily at 12 noon.   hydrochlorothiazide (HYDRODIURIL) 25 MG tablet Take 25 mg by mouth daily.   lisinopril (ZESTRIL) 10 MG tablet Take 10 mg by mouth daily.   metoprolol succinate (TOPROL-XL) 25 MG 24 hr tablet Take 1 tablet by mouth daily.   Multiple Vitamin (MULTIVITAMIN) tablet Take 1 tablet by mouth daily.   pantoprazole (PROTONIX) 40 MG tablet Take 1 tablet (40 mg total) by mouth 2 (two) times daily.   predniSONE (DELTASONE) 10 MG tablet Take 1 tablet (10 mg total) by mouth daily with breakfast.  Take 6 today, 5 tomorrow and decrease by 1 each day.   No current facility-administered medications on file prior to visit.    Allergies:  No Known Allergies  Social History:  Social History   Socioeconomic History   Marital status: Widowed    Spouse name: Not on file   Number of children: Not on file   Years of education: Not on file   Highest education level: Not on file  Occupational History   Not on file  Tobacco Use   Smoking status: Never   Smokeless tobacco: Never  Substance and Sexual Activity   Alcohol use: No   Drug use: No   Sexual activity: Not Currently  Other Topics Concern   Not on file  Social History Narrative   Not on file   Social Determinants of Health   Financial Resource Strain: Not on file  Food Insecurity: Not on file  Transportation Needs: Not on file  Physical Activity: Not on file  Stress: Not on file  Social Connections: Not on file  Intimate Partner Violence: Not on file   Social History   Tobacco Use  Smoking Status Never  Smokeless Tobacco Never   Social History   Substance and Sexual Activity   Alcohol Use No    Family History:  Family History  Problem Relation Age of Onset   Hypertension Mother     Past medical history, surgical history, medications, allergies, family history and social history reviewed with patient today and changes made to appropriate areas of the chart.   ROS  All other ROS negative except what is listed above and in the HPI.      Objective:    There were no vitals taken for this visit.  Wt Readings from Last 3 Encounters:  11/07/21 223 lb 9.6 oz (101.4 kg)  08/12/19 226 lb (102.5 kg)  04/15/19 225 lb (102.1 kg)    No results found.  Physical Exam      No data to display          Cognitive Testing - 6-CIT  Correct? Score   What year is it? {YES NO:22349} {Numbers; 0-4:31231} Yes = 0    No = 4  What month is it? {YES NO:22349} {Numbers; 0-4:31231} Yes = 0    No = 3  Remember:     Pia Mau, [place], [place]     What time is it? {YES NO:22349} {Numbers; 0-4:31231} Yes = 0    No = 3  Count backwards from 20 to 1 {YES NO:22349} {Numbers; 0-4:31231} Correct = 0    1 error = 2   More than 1 error = 4  Say the months of the year in reverse. {YES NO:22349} {Numbers; 0-4:31231} Correct = 0    1 error = 2   More than 1 error = 4  What address did I ask you to remember? {YES NO:22349} {NUMBERS; 0-10:5044} Correct = 0  1 error = 2    2 error = 4    3 error = 6    4 error = 8    All wrong = 10       TOTAL SCORE  {Numbers; 0-93:23557}/32   Interpretation:  {Desc; normal/abnormal:11317::"Normal"}  Normal (0-7) Abnormal (8-28)   Results for orders placed or performed during the hospital encounter of 07/05/20  Brain natriuretic peptide  Result Value Ref Range   B Natriuretic Peptide 142.2 (H) 0.0 - 100.0 pg/mL  Assessment & Plan:   Problem List Items Addressed This Visit   None    Preventative Services:  AAA screening:  Health Risk Assessment and Personalized Prevention Plan: Bone Mass Measurements: Breast Cancer  Screening: CVD Screening:  Cervical Cancer Screening: Colon Cancer Screening:  Depression Screening:  Diabetes Screening:  Glaucoma Screening:  Hepatitis B vaccine: Hepatitis C screening:  HIV Screening: Flu Vaccine: Lung cancer Screening: Obesity Screening:  Pneumonia Vaccines (2): STI Screening:  Follow up plan: No follow-ups on file.   LABORATORY TESTING:  - Pap smear: {Blank QQVZDG:38756::"EPP done","not applicable","up to date","done elsewhere"}  IMMUNIZATIONS:   - Tdap: Tetanus vaccination status reviewed: {tetanus status:315746}. - Influenza: {Blank single:19197::"Up to date","Administered today","Postponed to flu season","Refused","Given elsewhere"} - Pneumovax: {Blank single:19197::"Up to date","Administered today","Not applicable","Refused","Given elsewhere"} - Prevnar: {Blank single:19197::"Up to date","Administered today","Not applicable","Refused","Given elsewhere"} - Zostavax vaccine: {Blank single:19197::"Up to date","Administered today","Not applicable","Refused","Given elsewhere"}  SCREENING: -Mammogram: {Blank single:19197::"Up to date","Ordered today","Not applicable","Refused","Done elsewhere"}  - Colonoscopy: {Blank single:19197::"Up to date","Ordered today","Not applicable","Refused","Done elsewhere"}  - Bone Density: {Blank single:19197::"Up to date","Ordered today","Not applicable","Refused","Done elsewhere"}  -Hearing Test: {Blank single:19197::"Up to date","Ordered today","Not applicable","Refused","Done elsewhere"}  -Spirometry: {Blank single:19197::"Up to date","Ordered today","Not applicable","Refused","Done elsewhere"}   PATIENT COUNSELING:   Advised to take 1 mg of folate supplement per day if capable of pregnancy.   Sexuality: Discussed sexually transmitted diseases, partner selection, use of condoms, avoidance of unintended pregnancy  and contraceptive alternatives.   Advised to avoid cigarette smoking.  I discussed with the patient that  most people either abstain from alcohol or drink within safe limits (<=14/week and <=4 drinks/occasion for males, <=7/weeks and <= 3 drinks/occasion for females) and that the risk for alcohol disorders and other health effects rises proportionally with the number of drinks per week and how often a drinker exceeds daily limits.  Discussed cessation/primary prevention of drug use and availability of treatment for abuse.   Diet: Encouraged to adjust caloric intake to maintain  or achieve ideal body weight, to reduce intake of dietary saturated fat and total fat, to limit sodium intake by avoiding high sodium foods and not adding table salt, and to maintain adequate dietary potassium and calcium preferably from fresh fruits, vegetables, and low-fat dairy products.    stressed the importance of regular exercise  Injury prevention: Discussed safety belts, safety helmets, smoke detector, smoking near bedding or upholstery.   Dental health: Discussed importance of regular tooth brushing, flossing, and dental visits.    NEXT PREVENTATIVE PHYSICAL DUE IN 1 YEAR. No follow-ups on file.

## 2021-11-23 ENCOUNTER — Ambulatory Visit: Payer: PPO | Admitting: Nurse Practitioner

## 2021-11-23 ENCOUNTER — Other Ambulatory Visit: Payer: Self-pay

## 2021-11-23 DIAGNOSIS — Z0000 Encounter for general adult medical examination without abnormal findings: Secondary | ICD-10-CM

## 2021-11-23 DIAGNOSIS — I10 Essential (primary) hypertension: Secondary | ICD-10-CM

## 2021-11-23 DIAGNOSIS — Z136 Encounter for screening for cardiovascular disorders: Secondary | ICD-10-CM

## 2021-11-23 DIAGNOSIS — D509 Iron deficiency anemia, unspecified: Secondary | ICD-10-CM

## 2021-11-23 DIAGNOSIS — Z7189 Other specified counseling: Secondary | ICD-10-CM

## 2021-11-23 MED ORDER — METOPROLOL SUCCINATE ER 25 MG PO TB24: 25.0000 mg | ORAL_TABLET | Freq: Every day | ORAL | 0 refills | Status: DC

## 2021-11-23 MED ORDER — ESOMEPRAZOLE MAGNESIUM 20 MG PO CPDR: 20.0000 mg | DELAYED_RELEASE_CAPSULE | Freq: Every day | ORAL | 0 refills | Status: DC

## 2021-11-23 MED ORDER — HYDROCHLOROTHIAZIDE 25 MG PO TABS: 25.0000 mg | ORAL_TABLET | Freq: Every day | ORAL | 0 refills | Status: DC

## 2021-11-23 NOTE — Telephone Encounter (Signed)
Patient req refill for HCTZ, metorolol, nexium, patient states she was going to ask PCP today during her OV but karen is out of office and pt has been r/s. Please advise.

## 2021-12-07 ENCOUNTER — Encounter: Payer: Self-pay | Admitting: Nurse Practitioner

## 2021-12-07 ENCOUNTER — Ambulatory Visit (INDEPENDENT_AMBULATORY_CARE_PROVIDER_SITE_OTHER): Payer: PPO | Admitting: Nurse Practitioner

## 2021-12-07 VITALS — BP 136/64 | HR 73 | Temp 98.6°F | Wt 221.4 lb

## 2021-12-07 DIAGNOSIS — Z0000 Encounter for general adult medical examination without abnormal findings: Secondary | ICD-10-CM

## 2021-12-07 DIAGNOSIS — D509 Iron deficiency anemia, unspecified: Secondary | ICD-10-CM

## 2021-12-07 DIAGNOSIS — I10 Essential (primary) hypertension: Secondary | ICD-10-CM | POA: Diagnosis not present

## 2021-12-07 DIAGNOSIS — E7800 Pure hypercholesterolemia, unspecified: Secondary | ICD-10-CM

## 2021-12-07 DIAGNOSIS — Z7189 Other specified counseling: Secondary | ICD-10-CM

## 2021-12-07 DIAGNOSIS — Z23 Encounter for immunization: Secondary | ICD-10-CM

## 2021-12-07 MED ORDER — LISINOPRIL 20 MG PO TABS: 20.0000 mg | ORAL_TABLET | Freq: Every day | ORAL | 1 refills | Status: DC

## 2021-12-07 NOTE — Assessment & Plan Note (Signed)
Chronic. Not well controlled.  Will increase Lisinopril from '10mg'$  to '20mg'$ .  Labs ordered today.  Follow up in 1 month.  Call sooner if concerns arise.

## 2021-12-07 NOTE — Assessment & Plan Note (Signed)
A voluntary discussion about advance care planning including the explanation and discussion of advance directives was extensively discussed  with the patient for 5 minutes with patient.  Explanation about the health care proxy and Living will was reviewed and packet with forms with explanation of how to fill them out was given.  During this discussion, the patient was able to identify a health care proxy as her daughter and has already completed the paperwork.

## 2021-12-07 NOTE — Assessment & Plan Note (Signed)
Labs ordered at visit today.  Will make recommendations based on lab results.   

## 2021-12-07 NOTE — Progress Notes (Signed)
BP 136/64 (BP Location: Right Arm)   Pulse 73   Temp 98.6 F (37 C) (Oral)   Wt 221 lb 6.4 oz (100.4 kg)   SpO2 97%   BMI 36.84 kg/m    Subjective:    Patient ID: Dawn Gonzalez, female    DOB: 09-13-1945, 76 y.o.   MRN: [id number]  HPI: Dawn Gonzalez is a 76 y.o. female presenting on 12/07/2021 for comprehensive medical examination. Current medical complaints include:none  She currently lives with: Menopausal Symptoms: no  HYPERTENSION without Chronic Kidney Disease Hypertension status: controlled  Satisfied with current treatment? yes Duration of hypertension: years BP monitoring frequency:  daily BP range: 140-160/70 BP medication side effects:  no Medication compliance: excellent compliance Previous BP meds:lisinopril Aspirin: no Recurrent headaches: no Visual changes: no Palpitations: no Dyspnea: no Chest pain: no Lower extremity edema: no Dizzy/lightheaded: no  SLEEP DISTURBANCE Patient states she doesn't sleep well.  She only gets 3-4 hours of sleep nightly.  It is rare that she gets more than 4 hours of sleep nightly.  She tried melatonin which helped her get to sleep but it didn't help her   Functional Status Survey:       12/07/2021    2:58 PM 08/29/2018   10:15 AM  Fall Risk   Falls in the past year? 0 0  Comment  Emmi Telephone Survey: data to providers prior to load  Number falls in past yr: 0   Injury with Fall? 0   Risk for fall due to : No Fall Risks   Follow up Falls evaluation completed     Depression Screen    12/07/2021    2:59 PM  Depression screen PHQ 2/9  Decreased Interest 0  Down, Depressed, Hopeless 0  PHQ - 2 Score 0  Altered sleeping 2  Tired, decreased energy 1  Change in appetite 1  Feeling bad or failure about yourself  0  Trouble concentrating 0  Moving slowly or fidgety/restless 0  Suicidal thoughts 0  PHQ-9 Score 4  Difficult doing work/chores Somewhat difficult     Advanced Directives Does patient have a  HCPOA?    yes If yes, name and contact information:  Does patient have a living will or MOST form?  yes  Past Medical History:  Past Medical History:  Diagnosis Date   Abdominal pain    Acute diarrhea    Allergy    Anemia    Dyspnea    Elevated troponin    History of syncope    Hypertension    Nonrheumatic mitral valve regurgitation     Surgical History:  Past Surgical History:  Procedure Laterality Date   ABDOMINAL HYSTERECTOMY     COLONOSCOPY WITH PROPOFOL N/A 08/12/2019   Procedure: COLONOSCOPY WITH PROPOFOL;  Surgeon: Robert Bellow, MD;  Location: [facility] ENDOSCOPY;  Service: Endoscopy;  Laterality: N/A;   ESOPHAGOGASTRODUODENOSCOPY (EGD) WITH PROPOFOL N/A 08/12/2019   Procedure: ESOPHAGOGASTRODUODENOSCOPY (EGD) WITH PROPOFOL;  Surgeon: Robert Bellow, MD;  Location: [facility] ENDOSCOPY;  Service: Endoscopy;  Laterality: N/A;    Medications:  Current Outpatient Medications on File Prior to Visit  Medication Sig   esomeprazole (NEXIUM) 20 MG capsule Take 1 capsule (20 mg total) by mouth daily at 12 noon.   hydrochlorothiazide (HYDRODIURIL) 25 MG tablet Take 1 tablet (25 mg total) by mouth daily.   metoprolol succinate (TOPROL-XL) 25 MG 24 hr tablet Take 1 tablet (25 mg total) by mouth daily.   Multiple Vitamin (MULTIVITAMIN)  tablet Take 1 tablet by mouth daily.   calcium-vitamin D (OSCAL WITH D) 500-200 MG-UNIT tablet Take 1 tablet by mouth 3 (three) times a week. Takes three times a week (Patient not taking: Reported on 08/12/2019)   pantoprazole (PROTONIX) 40 MG tablet Take 1 tablet (40 mg total) by mouth 2 (two) times daily.   No current facility-administered medications on file prior to visit.    Allergies:  No Known Allergies  Social History:  Social History   Socioeconomic History   Marital status: Widowed    Spouse name: Not on file   Number of children: Not on file   Years of education: Not on file   Highest education level: Not on file  Occupational  History   Not on file  Tobacco Use   Smoking status: Never   Smokeless tobacco: Never  Substance and Sexual Activity   Alcohol use: No   Drug use: No   Sexual activity: Not Currently  Other Topics Concern   Not on file  Social History Narrative   Not on file   Social Determinants of Health   Financial Resource Strain: Not on file  Food Insecurity: Not on file  Transportation Needs: Not on file  Physical Activity: Not on file  Stress: Not on file  Social Connections: Not on file  Intimate Partner Violence: Not on file   Social History   Tobacco Use  Smoking Status Never  Smokeless Tobacco Never   Social History   Substance and Sexual Activity  Alcohol Use No    Family History:  Family History  Problem Relation Age of Onset   Hypertension Mother     Past medical history, surgical history, medications, allergies, family history and social history reviewed with patient today and changes made to appropriate areas of the chart.   Review of Systems  Eyes:  Negative for blurred vision and double vision.  Respiratory:  Negative for shortness of breath.   Cardiovascular:  Negative for chest pain, palpitations and leg swelling.  Neurological:  Negative for dizziness and headaches.  Psychiatric/Behavioral:  The patient has insomnia.     All other ROS negative except what is listed above and in the HPI.      Objective:    BP 136/64 (BP Location: Right Arm)   Pulse 73   Temp 98.6 F (37 C) (Oral)   Wt 221 lb 6.4 oz (100.4 kg)   SpO2 97%   BMI 36.84 kg/m   Wt Readings from Last 3 Encounters:  12/07/21 221 lb 6.4 oz (100.4 kg)  11/07/21 223 lb 9.6 oz (101.4 kg)  08/12/19 226 lb (102.5 kg)    No results found.  Physical Exam Vitals and nursing note reviewed.  Constitutional:      General: She is not in acute distress.    Appearance: Normal appearance. She is obese. She is not ill-appearing, toxic-appearing or diaphoretic.  HENT:     Head: Normocephalic.      Right Ear: External ear normal.     Left Ear: External ear normal.     Nose: Nose normal.     Mouth/Throat:     Mouth: Mucous membranes are moist.     Pharynx: Oropharynx is clear.  Eyes:     General:        Right eye: No discharge.        Left eye: No discharge.     Extraocular Movements: Extraocular movements intact.     Conjunctiva/sclera: Conjunctivae normal.  Pupils: Pupils are equal, round, and reactive to light.  Cardiovascular:     Rate and Rhythm: Normal rate and regular rhythm.     Heart sounds: No murmur heard. Pulmonary:     Effort: Pulmonary effort is normal. No respiratory distress.     Breath sounds: Normal breath sounds. No wheezing or rales.  Musculoskeletal:     Cervical back: Normal range of motion and neck supple.  Skin:    General: Skin is warm and dry.     Capillary Refill: Capillary refill takes less than 2 seconds.  Neurological:     General: No focal deficit present.     Mental Status: She is alert and oriented to person, place, and time. Mental status is at baseline.  Psychiatric:        Mood and Affect: Mood normal.        Behavior: Behavior normal.        Thought Content: Thought content normal.        Judgment: Judgment normal.         No data to display          Cognitive Testing - 6-CIT  Correct? Score   What year is it? yes 0 Yes = 0    No = 4  What month is it? yes 0 Yes = 0    No = 3  Remember:     [address]ia [address], [place], [place]     What time is it? yes 0 Yes = 0    No = 3  Count backwards from 20 to 1 yes 0 Correct = 0    1 error = 2   More than 1 error = 4  Say the months of the year in reverse. yes 0 Correct = 0    1 error = 2   More than 1 error = 4  What address did I ask you to remember? yes 2 Correct = 0  1 error = 2    2 error = 4    3 error = 6    4 error = 8    All wrong = 10       TOTAL SCORE  2/28   Interpretation:  Normal  Normal (0-7) Abnormal (8-28)   Results for orders placed or performed during the  hospital encounter of 07/05/20  Brain natriuretic peptide  Result Value Ref Range   B Natriuretic Peptide 142.2 (H) 0.0 - 100.0 pg/mL      Assessment & Plan:   Problem List Items Addressed This Visit       Cardiovascular and Mediastinum   Essential hypertension    Chronic. Not well controlled.  Will increase Lisinopril from 34m to 273m  Labs ordered today.  Follow up in 1 month.  Call sooner if concerns arise.       Relevant Medications   lisinopril (ZESTRIL) 20 MG tablet   Other Relevant Orders   Comp Met (CMET)     Other   Anemia, iron deficiency    Labs ordered at visit today.  Will make recommendations based on lab results.        Relevant Orders   CBC w/Diff   Advanced care planning/counseling discussion    A voluntary discussion about advance care planning including the explanation and discussion of advance directives was extensively discussed  with the patient for 5 minutes with patient.  Explanation about the health care proxy and Living will was reviewed and packet  with forms with explanation of how to fill them out was given.  During this discussion, the patient was able to identify a health care proxy as her daughter and has already completed the paperwork.       Other Visit Diagnoses     Annual physical exam    -  Primary   Health maintenance reviewed during visit today. Labs ordered.  Declined bone density.  Colonoscopy up to datte. Pneumonia shot given. Allergic to Flu.   Encounter for annual wellness exam in Medicare patient       Elevated cholesterol       Relevant Medications   lisinopril (ZESTRIL) 20 MG tablet   Other Relevant Orders   Lipid Profile   Need for vaccination against Streptococcus pneumoniae       Relevant Orders   Pneumococcal conjugate vaccine 20-valent (Prevnar 20) (Completed)        Preventative Services:  AAA screening: NA Health Risk Assessment and Personalized Prevention Plan: Up to date Bone Mass Measurements:  Declined Breast Cancer Screening: NA CVD Screening: Up to date Cervical Cancer Screening: NA Colon Cancer Screening: Up to date Depression Screening: Done today Diabetes Screening: Done today Glaucoma Screening: Up to date Hepatitis B vaccine: NA Hepatitis C screening: Up to date HIV Screening: Up to date Flu Vaccine:Up to date Lung cancer Screening: NA Obesity Screening: Up to date Pneumonia Vaccines (2): Up to date  STI Screening: NA  Follow up plan: Return in about 1 month (around 01/06/2022) for BP Check.   LABORATORY TESTING:  - Pap smear: not applicable  IMMUNIZATIONS:   - Tdap: Tetanus vaccination status reviewed: not up to date. - Influenza:  Allergic in 1970 - Pneumovax: Administered today - Prevnar: Up to date - Zostavax vaccine: Not applicable  SCREENING: -Mammogram: Not applicable  - Colonoscopy: Up to date  - Bone Density: Refused  -Hearing Test: Not applicable  -Spirometry: Not applicable   PATIENT COUNSELING:   Advised to take 1 mg of folate supplement per day if capable of pregnancy.   Sexuality: Discussed sexually transmitted diseases, partner selection, use of condoms, avoidance of unintended pregnancy  and contraceptive alternatives.   Advised to avoid cigarette smoking.  I discussed with the patient that most people either abstain from alcohol or drink within safe limits (<=14/week and <=4 drinks/occasion for males, <=7/weeks and <= 3 drinks/occasion for females) and that the risk for alcohol disorders and other health effects rises proportionally with the number of drinks per week and how often a drinker exceeds daily limits.  Discussed cessation/primary prevention of drug use and availability of treatment for abuse.   Diet: Encouraged to adjust caloric intake to maintain  or achieve ideal body weight, to reduce intake of dietary saturated fat and total fat, to limit sodium intake by avoiding high sodium foods and not adding table salt, and to  maintain adequate dietary potassium and calcium preferably from fresh fruits, vegetables, and low-fat dairy products.    stressed the importance of regular exercise  Injury prevention: Discussed safety belts, safety helmets, smoke detector, smoking near bedding or upholstery.   Dental health: Discussed importance of regular tooth brushing, flossing, and dental visits.    NEXT PREVENTATIVE PHYSICAL DUE IN 1 YEAR. Return in about 1 month (around 01/06/2022) for BP Check.

## 2021-12-08 LAB — CBC WITH DIFFERENTIAL/PLATELET
Basophils Absolute: 0.1 10*3/uL (ref 0.0–0.2)
Basos: 1 %
EOS (ABSOLUTE): 0.4 10*3/uL (ref 0.0–0.4)
Eos: 4 %
Hematocrit: 42.1 % (ref 34.0–46.6)
Hemoglobin: 13.7 g/dL (ref 11.1–15.9)
Immature Grans (Abs): 0 10*3/uL (ref 0.0–0.1)
Immature Granulocytes: 0 %
Lymphocytes Absolute: 2.6 10*3/uL (ref 0.7–3.1)
Lymphs: 31 %
MCH: 27.8 pg (ref 26.6–33.0)
MCHC: 32.5 g/dL (ref 31.5–35.7)
MCV: 85 fL (ref 79–97)
Monocytes Absolute: 0.6 10*3/uL (ref 0.1–0.9)
Monocytes: 7 %
Neutrophils Absolute: 4.7 10*3/uL (ref 1.4–7.0)
Neutrophils: 57 %
Platelets: 239 10*3/uL (ref 150–450)
RBC: 4.93 x10E6/uL (ref 3.77–5.28)
RDW: 12.6 % (ref 11.7–15.4)
WBC: 8.3 10*3/uL (ref 3.4–10.8)

## 2021-12-08 LAB — LIPID PANEL
Chol/HDL Ratio: 3.6 ratio (ref 0.0–4.4)
Cholesterol, Total: 185 mg/dL (ref 100–199)
HDL: 52 mg/dL (ref >39)
LDL Chol Calc (NIH): 119 mg/dL — ABNORMAL HIGH (ref 0–99)
Triglycerides: 75 mg/dL (ref 0–149)
VLDL Cholesterol Cal: 14 mg/dL (ref 5–40)

## 2021-12-08 LAB — COMPREHENSIVE METABOLIC PANEL
ALT: 6 IU/L (ref 0–32)
AST: 14 IU/L (ref 0–40)
Albumin/Globulin Ratio: 1.3 (ref 1.2–2.2)
Albumin: 4.3 g/dL (ref 3.8–4.8)
Alkaline Phosphatase: 94 IU/L (ref 44–121)
BUN/Creatinine Ratio: 24 (ref 12–28)
BUN: 20 mg/dL (ref 8–27)
Bilirubin Total: <0.2 mg/dL (ref 0.0–1.2)
CO2: 24 mmol/L (ref 20–29)
Calcium: 9.8 mg/dL (ref 8.7–10.3)
Chloride: 101 mmol/L (ref 96–106)
Creatinine, Ser: 0.85 mg/dL (ref 0.57–1.00)
Globulin, Total: 3.4 g/dL (ref 1.5–4.5)
Glucose: 87 mg/dL (ref 70–99)
Potassium: 4.1 mmol/L (ref 3.5–5.2)
Sodium: 141 mmol/L (ref 134–144)
Total Protein: 7.7 g/dL (ref 6.0–8.5)
eGFR: 71 mL/min/{1.73_m2} (ref >59)

## 2021-12-11 ENCOUNTER — Telehealth: Payer: Self-pay | Admitting: Nurse Practitioner

## 2021-12-11 NOTE — Progress Notes (Signed)
Hi Dawn Gonzalez. It was nice to see you last week.  Your lab work looks good.  Your bad cholesterol is slightly elevated.  I recommend a low fat diet and exercise.  No other concerns at this time. Continue with your current medication regimen.  Follow up as discussed.  Please let me know if you have any questions.

## 2021-12-11 NOTE — Telephone Encounter (Signed)
Pt returned call for lab results. Cb# [phone]

## 2021-12-11 NOTE — Telephone Encounter (Signed)
Called pt back to discuss lab results. LVMTCB

## 2021-12-27 NOTE — Progress Notes (Deleted)
There were no vitals taken for this visit.   Subjective:    Patient ID: Dawn Gonzalez, female    DOB: 1945-12-20, 76 y.o.   MRN: [id number]  HPI: Dawn Gonzalez is a 76 y.o. female  No chief complaint on file.  UPPER RESPIRATORY TRACT INFECTION Worst symptom: Fever: {Blank single:19197::"yes","no"} Cough: {Blank single:19197::"yes","no"} Shortness of breath: {Blank single:19197::"yes","no"} Wheezing: {Blank single:19197::"yes","no"} Chest pain: {Blank single:19197::"yes","no","yes, with cough"} Chest tightness: {Blank single:19197::"yes","no"} Chest congestion: {Blank single:19197::"yes","no"} Nasal congestion: {Blank single:19197::"yes","no"} Runny nose: {Blank single:19197::"yes","no"} Post nasal drip: {Blank single:19197::"yes","no"} Sneezing: {Blank single:19197::"yes","no"} Sore throat: {Blank single:19197::"yes","no"} Swollen glands: {Blank single:19197::"yes","no"} Sinus pressure: {Blank single:19197::"yes","no"} Headache: {Blank single:19197::"yes","no"} Face pain: {Blank single:19197::"yes","no"} Toothache: {Blank single:19197::"yes","no"} Ear pain: {Blank single:19197::"yes","no"} {Blank single:19197::""right","left", "bilateral"} Ear pressure: {Blank single:19197::"yes","no"} {Blank single:19197::""right","left", "bilateral"} Eyes red/itching:{Blank single:19197::"yes","no"} Eye drainage/crusting: {Blank single:19197::"yes","no"}  Vomiting: {Blank single:19197::"yes","no"} Rash: {Blank single:19197::"yes","no"} Fatigue: {Blank single:19197::"yes","no"} Sick contacts: {Blank single:19197::"yes","no"} Strep contacts: {Blank single:19197::"yes","no"}  Context: {Blank multiple:19196::"better","worse","stable","fluctuating"} Recurrent sinusitis: {Blank single:19197::"yes","no"} Relief with OTC cold/cough medications: {Blank single:19197::"yes","no"}  Treatments attempted: {Blank  multiple:19196::"none","cold/sinus","mucinex","anti-histamine","pseudoephedrine","cough syrup","antibiotics"}   Relevant past medical, surgical, family and social history reviewed and updated as indicated. Interim medical history since our last visit reviewed. Allergies and medications reviewed and updated.  Review of Systems  Per HPI unless specifically indicated above     Objective:    There were no vitals taken for this visit.  Wt Readings from Last 3 Encounters:  12/07/21 221 lb 6.4 oz (100.4 kg)  11/07/21 223 lb 9.6 oz (101.4 kg)  08/12/19 226 lb (102.5 kg)    Physical Exam  Results for orders placed or performed in visit on 12/07/21  Comp Met (CMET)  Result Value Ref Range   Glucose 87 70 - 99 mg/dL   BUN 20 8 - 27 mg/dL   Creatinine, Ser 0.85 0.57 - 1.00 mg/dL   eGFR 71 >59 mL/min/1.73   BUN/Creatinine Ratio 24 12 - 28   Sodium 141 134 - 144 mmol/L   Potassium 4.1 3.5 - 5.2 mmol/L   Chloride 101 96 - 106 mmol/L   CO2 24 20 - 29 mmol/L   Calcium 9.8 8.7 - 10.3 mg/dL   Total Protein 7.7 6.0 - 8.5 g/dL   Albumin 4.3 3.8 - 4.8 g/dL   Globulin, Total 3.4 1.5 - 4.5 g/dL   Albumin/Globulin Ratio 1.3 1.2 - 2.2   Bilirubin Total <0.2 0.0 - 1.2 mg/dL   Alkaline Phosphatase 94 44 - 121 IU/L   AST 14 0 - 40 IU/L   ALT 6 0 - 32 IU/L  Lipid Profile  Result Value Ref Range   Cholesterol, Total 185 100 - 199 mg/dL   Triglycerides 75 0 - 149 mg/dL   HDL 52 >39 mg/dL   VLDL Cholesterol Cal 14 5 - 40 mg/dL   LDL Chol Calc (NIH) 119 (H) 0 - 99 mg/dL   Chol/HDL Ratio 3.6 0.0 - 4.4 ratio  CBC w/Diff  Result Value Ref Range   WBC 8.3 3.4 - 10.8 x10E3/uL   RBC 4.93 3.77 - 5.28 x10E6/uL   Hemoglobin 13.7 11.1 - 15.9 g/dL   Hematocrit 42.1 34.0 - 46.6 %   MCV 85 79 - 97 fL   MCH 27.8 26.6 - 33.0 pg   MCHC 32.5 31.5 - 35.7 g/dL   RDW 12.6 11.7 - 15.4 %   Platelets 239 150 - 450 x10E3/uL   Neutrophils 57 Not Estab. %   Lymphs 31 Not Estab. %   Monocytes 7 Not  Estab. %   Eos  4 Not Estab. %   Basos 1 Not Estab. %   Neutrophils Absolute 4.7 1.4 - 7.0 x10E3/uL   Lymphocytes Absolute 2.6 0.7 - 3.1 x10E3/uL   Monocytes Absolute 0.6 0.1 - 0.9 x10E3/uL   EOS (ABSOLUTE) 0.4 0.0 - 0.4 x10E3/uL   Basophils Absolute 0.1 0.0 - 0.2 x10E3/uL   Immature Granulocytes 0 Not Estab. %   Immature Grans (Abs) 0.0 0.0 - 0.1 x10E3/uL      Assessment & Plan:   Problem List Items Addressed This Visit   None    Follow up plan: No follow-ups on file.

## 2021-12-28 ENCOUNTER — Ambulatory Visit: Payer: PPO | Admitting: Nurse Practitioner

## 2022-01-02 ENCOUNTER — Ambulatory Visit
Admission: RE | Admit: 2022-01-02 | Discharge: 2022-01-02 | Disposition: A | Discharge status: Home / Self Care | Payer: PPO | Source: Ambulatory Visit | Attending: Unknown Physician Specialty | Admitting: Unknown Physician Specialty

## 2022-01-02 ENCOUNTER — Ambulatory Visit (INDEPENDENT_AMBULATORY_CARE_PROVIDER_SITE_OTHER): Payer: PPO | Admitting: Unknown Physician Specialty

## 2022-01-02 ENCOUNTER — Ambulatory Visit
Admission: RE | Admit: 2022-01-02 | Discharge: 2022-01-02 | Disposition: A | Discharge status: Home / Self Care | Payer: PPO | Attending: Unknown Physician Specialty | Admitting: Unknown Physician Specialty

## 2022-01-02 ENCOUNTER — Encounter: Payer: Self-pay | Admitting: Unknown Physician Specialty

## 2022-01-02 VITALS — BP 159/83 | HR 62 | Temp 98.6°F | Wt 221.6 lb

## 2022-01-02 DIAGNOSIS — R059 Cough, unspecified: Secondary | ICD-10-CM | POA: Diagnosis not present

## 2022-01-02 DIAGNOSIS — R197 Diarrhea, unspecified: Secondary | ICD-10-CM | POA: Diagnosis not present

## 2022-01-02 DIAGNOSIS — R051 Acute cough: Secondary | ICD-10-CM | POA: Insufficient documentation

## 2022-01-02 NOTE — Patient Instructions (Signed)
Symbicort 2 puffs BID and then can take as needed.

## 2022-01-02 NOTE — Progress Notes (Signed)
BP (!) 159/83   Pulse 62   Temp 98.6 F (37 C) (Oral)   Wt 221 lb 9.6 oz (100.5 kg)   SpO2 97%   BMI 36.88 kg/m    Subjective:    Patient ID: Dawn Gonzalez, female    DOB: 06/23/1945, 76 y.o.   MRN: [id number]  HPI: Dawn Gonzalez is a 76 y.o. female  Chief Complaint  Patient presents with   URI    Pt states she has been having a cough, congestion, drainage, and a little short of breathe for the last 8 days. States she had bronchitis back in July, and pneumonia in October. States she feels like she is getting pneumonia again.    Diverticulitis    Pt states she feels like she is having a diverticulitis flare as she has been having diarrhea.    URI  This is a new problem. Episode onset: 8 days ago. The problem has been gradually improving. There has been no fever. Associated symptoms include congestion, coughing, diarrhea and rhinorrhea. Pertinent negatives include no abdominal pain, chest pain, dysuria, ear pain, headaches, joint pain, joint swelling, nausea, neck pain, plugged ear sensation, rash, sinus pain, sneezing, sore throat, swollen glands, vomiting or wheezing. She has tried nothing for the symptoms. The treatment provided no relief.   Chart review shows lost to f/u the abnormal chest x-ray.     Relevant past medical, surgical, family and social history reviewed and updated as indicated. Interim medical history since our last visit reviewed. Allergies and medications reviewed and updated.  Review of Systems  HENT:  Positive for congestion and rhinorrhea. Negative for ear pain, sinus pain, sneezing and sore throat.   Respiratory:  Positive for cough. Negative for wheezing.   Cardiovascular:  Negative for chest pain.  Gastrointestinal:  Positive for diarrhea. Negative for abdominal pain, nausea and vomiting.  Genitourinary:  Negative for dysuria.  Musculoskeletal:  Negative for joint pain and neck pain.  Skin:  Negative for rash.  Neurological:  Negative for  headaches.    Per HPI unless specifically indicated above     Objective:    BP (!) 159/83   Pulse 62   Temp 98.6 F (37 C) (Oral)   Wt 221 lb 9.6 oz (100.5 kg)   SpO2 97%   BMI 36.88 kg/m   Wt Readings from Last 3 Encounters:  01/02/22 221 lb 9.6 oz (100.5 kg)  12/07/21 221 lb 6.4 oz (100.4 kg)  11/07/21 223 lb 9.6 oz (101.4 kg)    Physical Exam Constitutional:      General: She is not in acute distress.    Appearance: Normal appearance. She is well-developed.  HENT:     Head: Normocephalic and atraumatic.  Eyes:     General: Lids are normal. No scleral icterus.       Right eye: No discharge.        Left eye: No discharge.     Conjunctiva/sclera: Conjunctivae normal.  Neck:     Vascular: No carotid bruit or JVD.  Cardiovascular:     Rate and Rhythm: Normal rate and regular rhythm.     Heart sounds: Normal heart sounds.  Pulmonary:     Effort: Pulmonary effort is normal. No respiratory distress.     Breath sounds: Examination of the right-lower field reveals wheezing. Examination of the left-lower field reveals wheezing. Wheezing present.  Abdominal:     Palpations: There is no hepatomegaly or splenomegaly.  Musculoskeletal:  General: Normal range of motion.     Cervical back: Normal range of motion and neck supple.  Skin:    General: Skin is warm and dry.     Coloration: Skin is not pale.     Findings: No rash.  Neurological:     Mental Status: She is alert and oriented to person, place, and time.  Psychiatric:        Behavior: Behavior normal.        Thought Content: Thought content normal.        Judgment: Judgment normal.     Results for orders placed or performed in visit on 12/07/21  Comp Met (CMET)  Result Value Ref Range   Glucose 87 70 - 99 mg/dL   BUN 20 8 - 27 mg/dL   Creatinine, Ser 0.85 0.57 - 1.00 mg/dL   eGFR 71 >59 mL/min/1.73   BUN/Creatinine Ratio 24 12 - 28   Sodium 141 134 - 144 mmol/L   Potassium 4.1 3.5 - 5.2 mmol/L    Chloride 101 96 - 106 mmol/L   CO2 24 20 - 29 mmol/L   Calcium 9.8 8.7 - 10.3 mg/dL   Total Protein 7.7 6.0 - 8.5 g/dL   Albumin 4.3 3.8 - 4.8 g/dL   Globulin, Total 3.4 1.5 - 4.5 g/dL   Albumin/Globulin Ratio 1.3 1.2 - 2.2   Bilirubin Total <0.2 0.0 - 1.2 mg/dL   Alkaline Phosphatase 94 44 - 121 IU/L   AST 14 0 - 40 IU/L   ALT 6 0 - 32 IU/L  Lipid Profile  Result Value Ref Range   Cholesterol, Total 185 100 - 199 mg/dL   Triglycerides 75 0 - 149 mg/dL   HDL 52 >39 mg/dL   VLDL Cholesterol Cal 14 5 - 40 mg/dL   LDL Chol Calc (NIH) 119 (H) 0 - 99 mg/dL   Chol/HDL Ratio 3.6 0.0 - 4.4 ratio  CBC w/Diff  Result Value Ref Range   WBC 8.3 3.4 - 10.8 x10E3/uL   RBC 4.93 3.77 - 5.28 x10E6/uL   Hemoglobin 13.7 11.1 - 15.9 g/dL   Hematocrit 42.1 34.0 - 46.6 %   MCV 85 79 - 97 fL   MCH 27.8 26.6 - 33.0 pg   MCHC 32.5 31.5 - 35.7 g/dL   RDW 12.6 11.7 - 15.4 %   Platelets 239 150 - 450 x10E3/uL   Neutrophils 57 Not Estab. %   Lymphs 31 Not Estab. %   Monocytes 7 Not Estab. %   Eos 4 Not Estab. %   Basos 1 Not Estab. %   Neutrophils Absolute 4.7 1.4 - 7.0 x10E3/uL   Lymphocytes Absolute 2.6 0.7 - 3.1 x10E3/uL   Monocytes Absolute 0.6 0.1 - 0.9 x10E3/uL   EOS (ABSOLUTE) 0.4 0.0 - 0.4 x10E3/uL   Basophils Absolute 0.1 0.0 - 0.2 x10E3/uL   Immature Granulocytes 0 Not Estab. %   Immature Grans (Abs) 0.0 0.0 - 0.1 x10E3/uL      Assessment & Plan:   Problem List Items Addressed This Visit       Unprioritized   Acute cough - Primary   Relevant Orders   DG Chest 2 View   Other Visit Diagnoses     Diarrhea, unspecified type       Diarrhea with upper abd pain also improving with diet changes.  Continue as is and will f/u prn        Follow up plan: Return if symptoms worsen or fail to  improve.

## 2022-01-03 ENCOUNTER — Other Ambulatory Visit: Payer: Self-pay | Admitting: Nurse Practitioner

## 2022-01-03 ENCOUNTER — Telehealth: Payer: Self-pay | Admitting: Unknown Physician Specialty

## 2022-01-03 MED ORDER — BUDESONIDE-FORMOTEROL FUMARATE 160-4.5 MCG/ACT IN AERO: 2.0000 | INHALATION_SPRAY | Freq: Two times a day (BID) | RESPIRATORY_TRACT | 3 refills | Status: DC

## 2022-01-03 NOTE — Telephone Encounter (Signed)
The patient saw the provider yesterday and was supposed to have an inhaler called In for her. Symbicort was mentioned but the patient states she checked yesterday and today at the pharmacy and they said they still have nothing. She uses  [facility], [place] [address] AT [place] Phone: [phone]  Fax: [phone]     Please assist patient further

## 2022-01-03 NOTE — Progress Notes (Signed)
Contacted via MyChart    Good evening Dawn Gonzalez, your imaging returned and no pneumonia seen.  Great news.  Continue plan of care as discussed with [name] and Symbicort was sent in.

## 2022-01-03 NOTE — Telephone Encounter (Signed)
Routing to provider to advise.  

## 2022-01-04 NOTE — Telephone Encounter (Signed)
Patient notified

## 2022-01-12 ENCOUNTER — Other Ambulatory Visit: Payer: Self-pay | Admitting: Nurse Practitioner

## 2022-01-12 NOTE — Telephone Encounter (Signed)
Copied from [place] [phone]. Topic: General - Other >> Jan 12, 2022 10:02 AM Cyndi Bender wrote: Reason for CRM: Pt reports that the Rx for the inhaler has not been approved by her insurance and her cough is not getting any better. Pt requests that a new Rx be sent to her pharmacy because she does not think the insurance will cover it. Pt requests call back to advise. Cb# [phone]

## 2022-01-15 MED ORDER — BUDESONIDE-FORMOTEROL FUMARATE 160-4.5 MCG/ACT IN AERO: 2.0000 | INHALATION_SPRAY | Freq: Two times a day (BID) | RESPIRATORY_TRACT | 1 refills | Status: DC

## 2022-01-15 NOTE — Telephone Encounter (Signed)
Called and notified patient that updated prescription was sent in for her.

## 2022-01-15 NOTE — Telephone Encounter (Signed)
Attempted PA for Symbicort. Per Cover My Meds, PA is not required for medication.   Called pharmacy and spoke to tech. Medication is not being filled due to the quantity that the prescription was written for. They state that the prescription needs to be written for 10.2. Can this be updated and sent in please? Originally prescribed by [name].

## 2022-02-05 NOTE — Progress Notes (Unsigned)
There were no vitals taken for this visit.   Subjective:    Patient ID: Dawn Gonzalez, female    DOB: November 19, 1945, 77 y.o.   MRN: [id number]  HPI: Dawn Gonzalez is a 77 y.o. female  No chief complaint on file.  HYPERTENSION {Blank single:19197::"without","with"} Chronic Kidney Disease Hypertension status: {Blank single:19197::"controlled","uncontrolled","better","worse","exacerbated","stable"}  Satisfied with current treatment? {Blank single:19197::"yes","no"} Duration of hypertension: {Blank single:19197::"chronic","months","years"} BP monitoring frequency:  {Blank single:19197::"not checking","rarely","daily","weekly","monthly","a few times a day","a few times a week","a few times a month"} BP range:  BP medication side effects:  {Blank single:19197::"yes","no"} Medication compliance: {Blank single:19197::"excellent compliance","good compliance","fair compliance","poor compliance"} Previous BP meds:{Blank MEQASTMH:96222::"LNLG","XQJJHERDEY","CXKGYJEHUD/JSHFWYOVZC","HYIFOYDX","AJOINOMVEH","MCNOBSJGGE/ZMOQ","HUTMLYYTKP (bystolic)","carvedilol","chlorthalidone","clonidine","diltiazem","exforge HCT","HCTZ","irbesartan (avapro)","labetalol","lisinopril","lisinopril-HCTZ","losartan (cozaar)","methyldopa","nifedipine","olmesartan (benicar)","olmesartan-HCTZ","quinapril","ramipril","spironalactone","tekturna","valsartan","valsartan-HCTZ","verapamil"} Aspirin: {Blank single:19197::"yes","no"} Recurrent headaches: {Blank single:19197::"yes","no"} Visual changes: {Blank single:19197::"yes","no"} Palpitations: {Blank single:19197::"yes","no"} Dyspnea: {Blank single:19197::"yes","no"} Chest pain: {Blank single:19197::"yes","no"} Lower extremity edema: {Blank single:19197::"yes","no"} Dizzy/lightheaded: {Blank single:19197::"yes","no"}  Relevant past medical, surgical, family and social history reviewed and updated as indicated. Interim medical history since our last visit reviewed. Allergies  and medications reviewed and updated.  Review of Systems  Per HPI unless specifically indicated above     Objective:    There were no vitals taken for this visit.  Wt Readings from Last 3 Encounters:  01/02/22 221 lb 9.6 oz (100.5 kg)  12/07/21 221 lb 6.4 oz (100.4 kg)  11/07/21 223 lb 9.6 oz (101.4 kg)    Physical Exam  Results for orders placed or performed in visit on 12/07/21  Comp Met (CMET)  Result Value Ref Range   Glucose 87 70 - 99 mg/dL   BUN 20 8 - 27 mg/dL   Creatinine, Ser 0.85 0.57 - 1.00 mg/dL   eGFR 71 >59 mL/min/1.73   BUN/Creatinine Ratio 24 12 - 28   Sodium 141 134 - 144 mmol/L   Potassium 4.1 3.5 - 5.2 mmol/L   Chloride 101 96 - 106 mmol/L   CO2 24 20 - 29 mmol/L   Calcium 9.8 8.7 - 10.3 mg/dL   Total Protein 7.7 6.0 - 8.5 g/dL   Albumin 4.3 3.8 - 4.8 g/dL   Globulin, Total 3.4 1.5 - 4.5 g/dL   Albumin/Globulin Ratio 1.3 1.2 - 2.2   Bilirubin Total <0.2 0.0 - 1.2 mg/dL   Alkaline Phosphatase 94 44 - 121 IU/L   AST 14 0 - 40 IU/L   ALT 6 0 - 32 IU/L  Lipid Profile  Result Value Ref Range   Cholesterol, Total 185 100 - 199 mg/dL   Triglycerides 75 0 - 149 mg/dL   HDL 52 >39 mg/dL   VLDL Cholesterol Cal 14 5 - 40 mg/dL   LDL Chol Calc (NIH) 119 (H) 0 - 99 mg/dL   Chol/HDL Ratio 3.6 0.0 - 4.4 ratio  CBC w/Diff  Result Value Ref Range   WBC 8.3 3.4 - 10.8 x10E3/uL   RBC 4.93 3.77 - 5.28 x10E6/uL   Hemoglobin 13.7 11.1 - 15.9 g/dL   Hematocrit 42.1 34.0 - 46.6 %   MCV 85 79 - 97 fL   MCH 27.8 26.6 - 33.0 pg   MCHC 32.5 31.5 - 35.7 g/dL   RDW 12.6 11.7 - 15.4 %   Platelets 239 150 - 450 x10E3/uL   Neutrophils 57 Not Estab. %   Lymphs 31 Not Estab. %   Monocytes 7 Not Estab. %   Eos 4 Not Estab. %   Basos 1 Not Estab. %   Neutrophils Absolute 4.7 1.4 - 7.0 x10E3/uL   Lymphocytes Absolute 2.6 0.7 - 3.1 x10E3/uL  Monocytes Absolute 0.6 0.1 - 0.9 x10E3/uL   EOS (ABSOLUTE) 0.4 0.0 - 0.4 x10E3/uL   Basophils Absolute 0.1 0.0 - 0.2 x10E3/uL    Immature Granulocytes 0 Not Estab. %   Immature Grans (Abs) 0.0 0.0 - 0.1 x10E3/uL      Assessment & Plan:   Problem List Items Addressed This Visit       Cardiovascular and Mediastinum   Essential hypertension - Primary     Follow up plan: No follow-ups on file.

## 2022-02-06 ENCOUNTER — Encounter: Payer: Self-pay | Admitting: Nurse Practitioner

## 2022-02-06 ENCOUNTER — Ambulatory Visit (INDEPENDENT_AMBULATORY_CARE_PROVIDER_SITE_OTHER): Payer: PPO | Admitting: Nurse Practitioner

## 2022-02-06 VITALS — BP 156/71 | HR 62 | Temp 99.2°F | Wt 215.7 lb

## 2022-02-06 DIAGNOSIS — I10 Essential (primary) hypertension: Secondary | ICD-10-CM | POA: Diagnosis not present

## 2022-02-06 MED ORDER — LISINOPRIL 40 MG PO TABS: 40.0000 mg | ORAL_TABLET | Freq: Every day | ORAL | 1 refills | Status: DC

## 2022-02-06 NOTE — Assessment & Plan Note (Signed)
Chronic. Not well controlled.  Will increase Lisinopril from '20mg'$  to '40mg'$ .  Labs ordered today.  Follow up in 6 weeks.  Call sooner if concerns arise.

## 2022-02-27 ENCOUNTER — Other Ambulatory Visit: Payer: Self-pay | Admitting: Nurse Practitioner

## 2022-02-27 NOTE — Telephone Encounter (Signed)
Requested Prescriptions  Pending Prescriptions Disp Refills   metoprolol succinate (TOPROL-XL) 25 MG 24 hr tablet [Pharmacy Med Name: METOPROLOL ER SUCCINATE '25MG'$  TABS] 90 tablet 1    Sig: TAKE 1 TABLET(25 MG) BY MOUTH DAILY     Cardiovascular:  Beta Blockers Failed - 02/27/2022 11:14 AM      Failed - Last BP in normal range    BP Readings from Last 1 Encounters:  02/06/22 (!) 156/71         Passed - Last Heart Rate in normal range    Pulse Readings from Last 1 Encounters:  02/06/22 62         Passed - Valid encounter within last 6 months    Recent Outpatient Visits           3 weeks ago Essential hypertension   [place], NP   1 month ago Acute cough   [place] Kathrine Haddock, NP   2 months ago Annual physical exam   [place], NP   3 months ago Acute bronchitis, unspecified organism   [place] Jon Billings, NP       Future Appointments             In 3 weeks Jon Billings, NP [place], [facility]

## 2022-02-28 ENCOUNTER — Other Ambulatory Visit: Payer: Self-pay | Admitting: Nurse Practitioner

## 2022-02-28 NOTE — Telephone Encounter (Signed)
Requested Prescriptions  Pending Prescriptions Disp Refills   hydrochlorothiazide (HYDRODIURIL) 25 MG tablet [Pharmacy Med Name: HYDROCHLOROTHIAZIDE '25MG'$  TABLETS] 90 tablet 3    Sig: TAKE 1 TABLET(25 MG) BY MOUTH DAILY     Cardiovascular: Diuretics - Thiazide Failed - 02/28/2022  9:19 AM      Failed - Last BP in normal range    BP Readings from Last 1 Encounters:  02/06/22 (!) 156/71         Passed - Cr in normal range and within 180 days    Creatinine, Ser  Date Value Ref Range Status  12/07/2021 0.85 0.57 - 1.00 mg/dL Final         Passed - K in normal range and within 180 days    Potassium  Date Value Ref Range Status  12/07/2021 4.1 3.5 - 5.2 mmol/L Final         Passed - Na in normal range and within 180 days    Sodium  Date Value Ref Range Status  12/07/2021 141 134 - 144 mmol/L Final         Passed - Valid encounter within last 6 months    Recent Outpatient Visits           3 weeks ago Essential hypertension   [place], NP   1 month ago Acute cough   [place] Kathrine Haddock, NP   2 months ago Annual physical exam   [place], NP   3 months ago Acute bronchitis, unspecified organism   [place] Jon Billings, NP       Future Appointments             In 2 weeks Jon Billings, NP [place], [facility]

## 2022-03-20 ENCOUNTER — Encounter: Payer: Self-pay | Admitting: Nurse Practitioner

## 2022-03-20 ENCOUNTER — Ambulatory Visit (INDEPENDENT_AMBULATORY_CARE_PROVIDER_SITE_OTHER): Payer: PPO | Admitting: Nurse Practitioner

## 2022-03-20 VITALS — BP 149/74 | HR 70 | Temp 99.4°F | Wt 221.1 lb

## 2022-03-20 DIAGNOSIS — I10 Essential (primary) hypertension: Secondary | ICD-10-CM | POA: Diagnosis not present

## 2022-03-20 MED ORDER — AMLODIPINE BESYLATE 5 MG PO TABS: 5.0000 mg | ORAL_TABLET | Freq: Every day | ORAL | 0 refills | Status: DC

## 2022-03-20 NOTE — Assessment & Plan Note (Signed)
Chronic. Not well controlled.  Will add Amlodipine 44m daily.  Side effects and benefits discussed.  Continue with HCTZ, Metoprolol and Lisinopril.  Follow up in 4 weeks.  Call sooner if concerns arise.

## 2022-03-20 NOTE — Progress Notes (Signed)
BP (!) 149/74 (BP Location: Left Arm, Cuff Size: Normal)   Pulse 70   Temp 99.4 F (37.4 C) (Oral)   Wt 221 lb 1.6 oz (100.3 kg)   SpO2 97%   BMI 36.79 kg/m    Subjective:    Patient ID: Dawn Gonzalez, female    DOB: 01-Aug-1945, 77 y.o.   MRN: [id number]  Dawn Gonzalez is a 77 y.o. female  Chief Complaint  Patient presents with   Hypertension   HYPERTENSION without Chronic Kidney Disease Hypertension status: uncontrolled  Satisfied with current treatment? yes Duration of hypertension: years BP monitoring frequency:  daily BP range: 148/153/78 BP medication side effects:  no Medication compliance: excellent compliance Previous BP meds:HCTZ and lisinopril Aspirin: no Recurrent headaches: no Visual changes: no Palpitations: no Dyspnea: no Chest pain: no Lower extremity edema: no Dizzy/lightheaded: no  Relevant past medical, surgical, family and social history reviewed and updated as indicated. Interim medical history since our last visit reviewed. Allergies and medications reviewed and updated.  Review of Systems  Eyes:  Negative for visual disturbance.  Respiratory:  Negative for cough, chest tightness and shortness of breath.   Cardiovascular:  Negative for chest pain, palpitations and leg swelling.  Neurological:  Negative for dizziness and headaches.    Per HPI unless specifically indicated above     Objective:    BP (!) 149/74 (BP Location: Left Arm, Cuff Size: Normal)   Pulse 70   Temp 99.4 F (37.4 C) (Oral)   Wt 221 lb 1.6 oz (100.3 kg)   SpO2 97%   BMI 36.79 kg/m   Wt Readings from Last 3 Encounters:  03/20/22 221 lb 1.6 oz (100.3 kg)  02/06/22 215 lb 11.2 oz (97.8 kg)  01/02/22 221 lb 9.6 oz (100.5 kg)    Physical Exam Vitals and nursing note reviewed.  Constitutional:      General: She is not in acute distress.    Appearance: Normal appearance. She is not ill-appearing, toxic-appearing or diaphoretic.  HENT:     Head:  Normocephalic.     Right Ear: External ear normal.     Left Ear: External ear normal.     Nose: Nose normal.     Mouth/Throat:     Mouth: Mucous membranes are moist.     Pharynx: Oropharynx is clear.  Eyes:     General:        Right eye: No discharge.        Left eye: No discharge.     Extraocular Movements: Extraocular movements intact.     Conjunctiva/sclera: Conjunctivae normal.     Pupils: Pupils are equal, round, and reactive to light.  Cardiovascular:     Rate and Rhythm: Normal rate and regular rhythm.     Heart sounds: No murmur heard. Pulmonary:     Effort: Pulmonary effort is normal. No respiratory distress.     Breath sounds: Normal breath sounds. No wheezing or rales.  Musculoskeletal:     Cervical back: Normal range of motion and neck supple.  Skin:    General: Skin is warm and dry.     Capillary Refill: Capillary refill takes less than 2 seconds.  Neurological:     General: No focal deficit present.     Mental Status: She is alert and oriented to person, place, and time. Mental status is at baseline.  Psychiatric:        Mood and Affect: Mood normal.  Behavior: Behavior normal.        Thought Content: Thought content normal.        Judgment: Judgment normal.     Results for orders placed or performed in visit on 12/07/21  Comp Met (CMET)  Result Value Ref Range   Glucose 87 70 - 99 mg/dL   BUN 20 8 - 27 mg/dL   Creatinine, Ser 0.85 0.57 - 1.00 mg/dL   eGFR 71 >59 mL/min/1.73   BUN/Creatinine Ratio 24 12 - 28   Sodium 141 134 - 144 mmol/L   Potassium 4.1 3.5 - 5.2 mmol/L   Chloride 101 96 - 106 mmol/L   CO2 24 20 - 29 mmol/L   Calcium 9.8 8.7 - 10.3 mg/dL   Total Protein 7.7 6.0 - 8.5 g/dL   Albumin 4.3 3.8 - 4.8 g/dL   Globulin, Total 3.4 1.5 - 4.5 g/dL   Albumin/Globulin Ratio 1.3 1.2 - 2.2   Bilirubin Total <0.2 0.0 - 1.2 mg/dL   Alkaline Phosphatase 94 44 - 121 IU/L   AST 14 0 - 40 IU/L   ALT 6 0 - 32 IU/L  Lipid Profile  Result Value  Ref Range   Cholesterol, Total 185 100 - 199 mg/dL   Triglycerides 75 0 - 149 mg/dL   HDL 52 >39 mg/dL   VLDL Cholesterol Cal 14 5 - 40 mg/dL   LDL Chol Calc (NIH) 119 (H) 0 - 99 mg/dL   Chol/HDL Ratio 3.6 0.0 - 4.4 ratio  CBC w/Diff  Result Value Ref Range   WBC 8.3 3.4 - 10.8 x10E3/uL   RBC 4.93 3.77 - 5.28 x10E6/uL   Hemoglobin 13.7 11.1 - 15.9 g/dL   Hematocrit 42.1 34.0 - 46.6 %   MCV 85 79 - 97 fL   MCH 27.8 26.6 - 33.0 pg   MCHC 32.5 31.5 - 35.7 g/dL   RDW 12.6 11.7 - 15.4 %   Platelets 239 150 - 450 x10E3/uL   Neutrophils 57 Not Estab. %   Lymphs 31 Not Estab. %   Monocytes 7 Not Estab. %   Eos 4 Not Estab. %   Basos 1 Not Estab. %   Neutrophils Absolute 4.7 1.4 - 7.0 x10E3/uL   Lymphocytes Absolute 2.6 0.7 - 3.1 x10E3/uL   Monocytes Absolute 0.6 0.1 - 0.9 x10E3/uL   EOS (ABSOLUTE) 0.4 0.0 - 0.4 x10E3/uL   Basophils Absolute 0.1 0.0 - 0.2 x10E3/uL   Immature Granulocytes 0 Not Estab. %   Immature Grans (Abs) 0.0 0.0 - 0.1 x10E3/uL      Assessment & Plan:   Problem List Items Addressed This Visit       Cardiovascular and Mediastinum   Essential hypertension - Primary    Chronic. Not well controlled.  Will add Amlodipine 46m daily.  Side effects and benefits discussed.  Continue with HCTZ, Metoprolol and Lisinopril.  Follow up in 4 weeks.  Call sooner if concerns arise.       Relevant Medications   amLODipine (NORVASC) 5 MG tablet     Follow up plan: Return in about 1 month (around 04/18/2022) for BP Check.

## 2022-03-23 ENCOUNTER — Telehealth: Payer: Self-pay | Admitting: Nurse Practitioner

## 2022-03-23 NOTE — Telephone Encounter (Signed)
The patient called in to let the provider know to be on the lookout for new form for prior authorization to get her budesonide-formoterol (SYMBICORT) 160-4.5 MCG/ACT inhaler at a lower cost this year. Please assist patient further

## 2022-03-26 MED ORDER — TRELEGY ELLIPTA 100-62.5-25 MCG/ACT IN AEPB: 1.0000 | INHALATION_SPRAY | Freq: Every day | RESPIRATORY_TRACT | 11 refills | Status: DC

## 2022-03-26 NOTE — Telephone Encounter (Signed)
Medication changed to Trelegy due to insurance preference.

## 2022-04-16 ENCOUNTER — Other Ambulatory Visit: Payer: Self-pay | Admitting: Nurse Practitioner

## 2022-04-17 NOTE — Telephone Encounter (Signed)
Requested Prescriptions  Pending Prescriptions Disp Refills   esomeprazole (NEXIUM) 20 MG capsule [Pharmacy Med Name: ESOMEPRAZOLE MAGNESIUM 20MG  DR CAPS] 90 capsule 0    Sig: TAKE 1 CAPSULE BY MOUTH ONCE DAILY AT 12 NOON     Gastroenterology: Proton Pump Inhibitors 2 Passed - 04/16/2022  6:21 AM      Passed - ALT in normal range and within 360 days    ALT  Date Value Ref Range Status  12/07/2021 6 0 - 32 IU/L Final         Passed - AST in normal range and within 360 days    AST  Date Value Ref Range Status  12/07/2021 14 0 - 40 IU/L Final         Passed - Valid encounter within last 12 months    Recent Outpatient Visits           4 weeks ago Essential hypertension   [place], NP   2 months ago Essential hypertension   [place], NP   3 months ago Acute cough   [place] Kathrine Haddock, NP   4 months ago Annual physical exam   [place], NP   5 months ago Acute bronchitis, unspecified organism   [place] Jon Billings, NP       Future Appointments             Tomorrow Jon Billings, NP [place], [facility]

## 2022-04-18 ENCOUNTER — Ambulatory Visit (INDEPENDENT_AMBULATORY_CARE_PROVIDER_SITE_OTHER): Payer: PPO | Admitting: Nurse Practitioner

## 2022-04-18 ENCOUNTER — Encounter: Payer: Self-pay | Admitting: Nurse Practitioner

## 2022-04-18 VITALS — BP 124/77 | HR 57 | Temp 99.5°F | Ht 65.0 in | Wt 222.3 lb

## 2022-04-18 DIAGNOSIS — I10 Essential (primary) hypertension: Secondary | ICD-10-CM

## 2022-04-18 NOTE — Progress Notes (Signed)
BP 124/77 (BP Location: Left Arm, Cuff Size: Normal)   Pulse (!) 57   Temp 99.5 F (37.5 C) (Oral)   Ht 5\' 5"  (1.651 m)   Wt 222 lb 4.8 oz (100.8 kg)   SpO2 95%   BMI 36.99 kg/m    Subjective:    Patient ID: Dawn Gonzalez, female    DOB: 1945-07-22, 77 y.o.   MRN: [id number]  HPI: Dawn Gonzalez is a 77 y.o. female  Chief Complaint  Patient presents with   Hypertension   HYPERTENSION without Chronic Kidney Disease Hypertension status: controlled Satisfied with current treatment? yes Duration of hypertension: years BP monitoring frequency:  daily BP range: 130/60 BP medication side effects:  no Medication compliance: excellent compliance Previous BP meds:HCTZ and lisinopril and Amlodipine Aspirin: no Recurrent headaches: no Visual changes: no Palpitations: no Dyspnea: no Chest pain: no Lower extremity edema: no Dizzy/lightheaded: no  Relevant past medical, surgical, family and social history reviewed and updated as indicated. Interim medical history since our last visit reviewed. Allergies and medications reviewed and updated.  Review of Systems  Eyes:  Negative for visual disturbance.  Respiratory:  Negative for cough, chest tightness and shortness of breath.   Cardiovascular:  Negative for chest pain, palpitations and leg swelling.  Neurological:  Negative for dizziness and headaches.    Per HPI unless specifically indicated above     Objective:    BP 124/77 (BP Location: Left Arm, Cuff Size: Normal)   Pulse (!) 57   Temp 99.5 F (37.5 C) (Oral)   Ht 5\' 5"  (1.651 m)   Wt 222 lb 4.8 oz (100.8 kg)   SpO2 95%   BMI 36.99 kg/m   Wt Readings from Last 3 Encounters:  04/18/22 222 lb 4.8 oz (100.8 kg)  03/20/22 221 lb 1.6 oz (100.3 kg)  02/06/22 215 lb 11.2 oz (97.8 kg)    Physical Exam Vitals and nursing note reviewed.  Constitutional:      General: She is not in acute distress.    Appearance: Normal appearance. She is not ill-appearing,  toxic-appearing or diaphoretic.  HENT:     Head: Normocephalic.     Right Ear: External ear normal.     Left Ear: External ear normal.     Nose: Nose normal.     Mouth/Throat:     Mouth: Mucous membranes are moist.     Pharynx: Oropharynx is clear.  Eyes:     General:        Right eye: No discharge.        Left eye: No discharge.     Extraocular Movements: Extraocular movements intact.     Conjunctiva/sclera: Conjunctivae normal.     Pupils: Pupils are equal, round, and reactive to light.  Cardiovascular:     Rate and Rhythm: Normal rate and regular rhythm.     Heart sounds: No murmur heard. Pulmonary:     Effort: Pulmonary effort is normal. No respiratory distress.     Breath sounds: Normal breath sounds. No wheezing or rales.  Musculoskeletal:     Cervical back: Normal range of motion and neck supple.  Skin:    General: Skin is warm and dry.     Capillary Refill: Capillary refill takes less than 2 seconds.  Neurological:     General: No focal deficit present.     Mental Status: She is alert and oriented to person, place, and time. Mental status is at baseline.  Psychiatric:  Mood and Affect: Mood normal.        Behavior: Behavior normal.        Thought Content: Thought content normal.        Judgment: Judgment normal.     Results for orders placed or performed in visit on 12/07/21  Comp Met (CMET)  Result Value Ref Range   Glucose 87 70 - 99 mg/dL   BUN 20 8 - 27 mg/dL   Creatinine, Ser 0.85 0.57 - 1.00 mg/dL   eGFR 71 >59 mL/min/1.73   BUN/Creatinine Ratio 24 12 - 28   Sodium 141 134 - 144 mmol/L   Potassium 4.1 3.5 - 5.2 mmol/L   Chloride 101 96 - 106 mmol/L   CO2 24 20 - 29 mmol/L   Calcium 9.8 8.7 - 10.3 mg/dL   Total Protein 7.7 6.0 - 8.5 g/dL   Albumin 4.3 3.8 - 4.8 g/dL   Globulin, Total 3.4 1.5 - 4.5 g/dL   Albumin/Globulin Ratio 1.3 1.2 - 2.2   Bilirubin Total <0.2 0.0 - 1.2 mg/dL   Alkaline Phosphatase 94 44 - 121 IU/L   AST 14 0 - 40 IU/L    ALT 6 0 - 32 IU/L  Lipid Profile  Result Value Ref Range   Cholesterol, Total 185 100 - 199 mg/dL   Triglycerides 75 0 - 149 mg/dL   HDL 52 >39 mg/dL   VLDL Cholesterol Cal 14 5 - 40 mg/dL   LDL Chol Calc (NIH) 119 (H) 0 - 99 mg/dL   Chol/HDL Ratio 3.6 0.0 - 4.4 ratio  CBC w/Diff  Result Value Ref Range   WBC 8.3 3.4 - 10.8 x10E3/uL   RBC 4.93 3.77 - 5.28 x10E6/uL   Hemoglobin 13.7 11.1 - 15.9 g/dL   Hematocrit 42.1 34.0 - 46.6 %   MCV 85 79 - 97 fL   MCH 27.8 26.6 - 33.0 pg   MCHC 32.5 31.5 - 35.7 g/dL   RDW 12.6 11.7 - 15.4 %   Platelets 239 150 - 450 x10E3/uL   Neutrophils 57 Not Estab. %   Lymphs 31 Not Estab. %   Monocytes 7 Not Estab. %   Eos 4 Not Estab. %   Basos 1 Not Estab. %   Neutrophils Absolute 4.7 1.4 - 7.0 x10E3/uL   Lymphocytes Absolute 2.6 0.7 - 3.1 x10E3/uL   Monocytes Absolute 0.6 0.1 - 0.9 x10E3/uL   EOS (ABSOLUTE) 0.4 0.0 - 0.4 x10E3/uL   Basophils Absolute 0.1 0.0 - 0.2 x10E3/uL   Immature Granulocytes 0 Not Estab. %   Immature Grans (Abs) 0.0 0.0 - 0.1 x10E3/uL      Assessment & Plan:   Problem List Items Addressed This Visit       Cardiovascular and Mediastinum   Essential hypertension - Primary    Chronic.  Controlled.  Continue with current medication regimen of Lisinopril, HCTZ and Amlodipine.  Labs ordered today.  Return to clinic in 3 months for reevaluation.  Call sooner if concerns arise.        Relevant Orders   Comp Met (CMET)     Follow up plan: Return in about 3 months (around 07/19/2022) for HTN, HLD, DM2 FU.

## 2022-04-18 NOTE — Assessment & Plan Note (Signed)
Chronic.  Controlled.  Continue with current medication regimen of Lisinopril, HCTZ and Amlodipine.  Labs ordered today.  Return to clinic in 3 months for reevaluation.  Call sooner if concerns arise.

## 2022-04-19 LAB — COMPREHENSIVE METABOLIC PANEL
ALT: 8 IU/L (ref 0–32)
AST: 13 IU/L (ref 0–40)
Albumin/Globulin Ratio: 1.2 (ref 1.2–2.2)
Albumin: 4.2 g/dL (ref 3.8–4.8)
Alkaline Phosphatase: 96 IU/L (ref 44–121)
BUN/Creatinine Ratio: 16 (ref 12–28)
BUN: 17 mg/dL (ref 8–27)
Bilirubin Total: 0.3 mg/dL (ref 0.0–1.2)
CO2: 26 mmol/L (ref 20–29)
Calcium: 9.8 mg/dL (ref 8.7–10.3)
Chloride: 100 mmol/L (ref 96–106)
Creatinine, Ser: 1.09 mg/dL — ABNORMAL HIGH (ref 0.57–1.00)
Globulin, Total: 3.4 g/dL (ref 1.5–4.5)
Glucose: 75 mg/dL (ref 70–99)
Potassium: 4.4 mmol/L (ref 3.5–5.2)
Sodium: 141 mmol/L (ref 134–144)
Total Protein: 7.6 g/dL (ref 6.0–8.5)
eGFR: 52 mL/min/{1.73_m2} — ABNORMAL LOW (ref >59)

## 2022-04-19 NOTE — Progress Notes (Signed)
HI Ms. Dawn Gonzalez. It was nice to see you yesterday.  Your lab work looks good.  Make sure you are drinking plenty of water.  No concerns at this time. Continue with your current medication regimen.  Follow up as discussed.  Please let me know if you have any questions.

## 2022-06-04 DIAGNOSIS — G8929 Other chronic pain: Secondary | ICD-10-CM | POA: Diagnosis not present

## 2022-06-04 DIAGNOSIS — M1711 Unilateral primary osteoarthritis, right knee: Secondary | ICD-10-CM | POA: Diagnosis not present

## 2022-06-15 ENCOUNTER — Other Ambulatory Visit: Payer: Self-pay | Admitting: Nurse Practitioner

## 2022-06-15 NOTE — Telephone Encounter (Signed)
Requested Prescriptions  Pending Prescriptions Disp Refills   amLODipine (NORVASC) 5 MG tablet [Pharmacy Med Name: AMLODIPINE BESYLATE 5MG  TABLETS] 90 tablet 0    Sig: TAKE 1 TABLET(5 MG) BY MOUTH DAILY     Cardiovascular: Calcium Channel Blockers 2 Passed - 06/15/2022  6:22 AM      Passed - Last BP in normal range    BP Readings from Last 1 Encounters:  04/18/22 124/77         Passed - Last Heart Rate in normal range    Pulse Readings from Last 1 Encounters:  04/18/22 (!) 57         Passed - Valid encounter within last 6 months    Recent Outpatient Visits           1 month ago Essential hypertension   [place] [facility] Larae Grooms, NP   2 months ago Essential hypertension   [place] [facility] Larae Grooms, NP   4 months ago Essential hypertension   [place] [facility] Larae Grooms, NP   5 months ago Acute cough   [place] [facility] Gabriel Cirri, NP   6 months ago Annual physical exam    [place] [facility] Larae Grooms, NP       Future Appointments             In 1 month Larae Grooms, NP  [facility], [facility]

## 2022-07-15 ENCOUNTER — Other Ambulatory Visit: Payer: Self-pay | Admitting: Nurse Practitioner

## 2022-07-16 NOTE — Telephone Encounter (Signed)
Requested Prescriptions  Pending Prescriptions Disp Refills   esomeprazole (NEXIUM) 20 MG capsule [Pharmacy Med Name: ESOMEPRAZOLE MAGNESIUM 20MG  DR CAPS] 90 capsule 2    Sig: TAKE 1 CAPSULE BY MOUTH ONCE DAILY AT NOON     Gastroenterology: Proton Pump Inhibitors 2 Passed - 07/15/2022  6:20 AM      Passed - ALT in normal range and within 360 days    ALT  Date Value Ref Range Status  04/18/2022 8 0 - 32 IU/L Final         Passed - AST in normal range and within 360 days    AST  Date Value Ref Range Status  04/18/2022 13 0 - 40 IU/L Final         Passed - Valid encounter within last 12 months    Recent Outpatient Visits           2 months ago Essential hypertension   [place] [facility] Larae Grooms, NP   3 months ago Essential hypertension   [place] [facility] Larae Grooms, NP   5 months ago Essential hypertension   [place] [facility] Larae Grooms, NP   6 months ago Acute cough   [place] [facility] Gabriel Cirri, NP   7 months ago Annual physical exam   [place] [facility] Larae Grooms, NP       Future Appointments             In 3 days Larae Grooms, NP [place] [facility], [facility]

## 2022-07-18 DIAGNOSIS — E7800 Pure hypercholesterolemia, unspecified: Secondary | ICD-10-CM | POA: Insufficient documentation

## 2022-07-18 NOTE — Progress Notes (Unsigned)
There were no vitals taken for this visit.   Subjective:    Patient ID: Dawn Gonzalez, female    DOB: 03-Feb-1945, 77 y.o.   MRN: [id number]  HPI: Dawn Gonzalez is a 77 y.o. female  No chief complaint on file.  HYPERTENSION without Chronic Kidney Disease Hypertension status: controlled Satisfied with current treatment? yes Duration of hypertension: years BP monitoring frequency:  daily BP range: 130/60 BP medication side effects:  no Medication compliance: excellent compliance Previous BP meds:HCTZ and lisinopril and Amlodipine Aspirin: no Recurrent headaches: no Visual changes: no Palpitations: no Dyspnea: no Chest pain: no Lower extremity edema: no Dizzy/lightheaded: no  Relevant past medical, surgical, family and social history reviewed and updated as indicated. Interim medical history since our last visit reviewed. Allergies and medications reviewed and updated.  Review of Systems  Eyes:  Negative for visual disturbance.  Respiratory:  Negative for cough, chest tightness and shortness of breath.   Cardiovascular:  Negative for chest pain, palpitations and leg swelling.  Neurological:  Negative for dizziness and headaches.    Per HPI unless specifically indicated above     Objective:    There were no vitals taken for this visit.  Wt Readings from Last 3 Encounters:  04/18/22 222 lb 4.8 oz (100.8 kg)  03/20/22 221 lb 1.6 oz (100.3 kg)  02/06/22 215 lb 11.2 oz (97.8 kg)    Physical Exam Vitals and nursing note reviewed.  Constitutional:      General: She is not in acute distress.    Appearance: Normal appearance. She is not ill-appearing, toxic-appearing or diaphoretic.  HENT:     Head: Normocephalic.     Right Ear: External ear normal.     Left Ear: External ear normal.     Nose: Nose normal.     Mouth/Throat:     Mouth: Mucous membranes are moist.     Pharynx: Oropharynx is clear.  Eyes:     General:        Right eye: No discharge.         Left eye: No discharge.     Extraocular Movements: Extraocular movements intact.     Conjunctiva/sclera: Conjunctivae normal.     Pupils: Pupils are equal, round, and reactive to light.  Cardiovascular:     Rate and Rhythm: Normal rate and regular rhythm.     Heart sounds: No murmur heard. Pulmonary:     Effort: Pulmonary effort is normal. No respiratory distress.     Breath sounds: Normal breath sounds. No wheezing or rales.  Musculoskeletal:     Cervical back: Normal range of motion and neck supple.  Skin:    General: Skin is warm and dry.     Capillary Refill: Capillary refill takes less than 2 seconds.  Neurological:     General: No focal deficit present.     Mental Status: She is alert and oriented to person, place, and time. Mental status is at baseline.  Psychiatric:        Mood and Affect: Mood normal.        Behavior: Behavior normal.        Thought Content: Thought content normal.        Judgment: Judgment normal.    Results for orders placed or performed in visit on 04/18/22  Comp Met (CMET)  Result Value Ref Range   Glucose 75 70 - 99 mg/dL   BUN 17 8 - 27 mg/dL   Creatinine, Ser 4.09 (H)  0.57 - 1.00 mg/dL   eGFR 52 (L) >16 XW/RUE/4.54   BUN/Creatinine Ratio 16 12 - 28   Sodium 141 134 - 144 mmol/L   Potassium 4.4 3.5 - 5.2 mmol/L   Chloride 100 96 - 106 mmol/L   CO2 26 20 - 29 mmol/L   Calcium 9.8 8.7 - 10.3 mg/dL   Total Protein 7.6 6.0 - 8.5 g/dL   Albumin 4.2 3.8 - 4.8 g/dL   Globulin, Total 3.4 1.5 - 4.5 g/dL   Albumin/Globulin Ratio 1.2 1.2 - 2.2   Bilirubin Total 0.3 0.0 - 1.2 mg/dL   Alkaline Phosphatase 96 44 - 121 IU/L   AST 13 0 - 40 IU/L   ALT 8 0 - 32 IU/L      Assessment & Plan:   Problem List Items Addressed This Visit   None    Follow up plan: No follow-ups on file.

## 2022-07-19 ENCOUNTER — Ambulatory Visit (INDEPENDENT_AMBULATORY_CARE_PROVIDER_SITE_OTHER): Payer: PPO | Admitting: Nurse Practitioner

## 2022-07-19 ENCOUNTER — Encounter: Payer: Self-pay | Admitting: Nurse Practitioner

## 2022-07-19 VITALS — BP 138/75 | HR 58 | Temp 98.6°F | Wt 222.6 lb

## 2022-07-19 DIAGNOSIS — E7800 Pure hypercholesterolemia, unspecified: Secondary | ICD-10-CM | POA: Diagnosis not present

## 2022-07-19 DIAGNOSIS — I10 Essential (primary) hypertension: Secondary | ICD-10-CM

## 2022-07-19 MED ORDER — AMLODIPINE BESYLATE 5 MG PO TABS: ORAL_TABLET | ORAL | 1 refills | Status: DC

## 2022-07-19 MED ORDER — METOPROLOL SUCCINATE ER 25 MG PO TB24: ORAL_TABLET | ORAL | 1 refills | Status: DC

## 2022-07-19 MED ORDER — LISINOPRIL 40 MG PO TABS: 40.0000 mg | ORAL_TABLET | Freq: Every day | ORAL | 1 refills | Status: DC

## 2022-07-19 NOTE — Assessment & Plan Note (Signed)
Labs ordered at visit today.  Will make recommendations based on lab results.   

## 2022-07-19 NOTE — Assessment & Plan Note (Signed)
Chronic.  Controlled.  Continue with current medication regimen of Lisinopril, HCTZ and Amlodipine.  Refills sent today.  Continue to check blood pressures at home. Labs ordered today.  Return to clinic in 6 months for reevaluation.  Call sooner if concerns arise.

## 2022-07-20 LAB — LIPID PANEL
Chol/HDL Ratio: 3.5 ratio (ref 0.0–4.4)
Cholesterol, Total: 181 mg/dL (ref 100–199)
HDL: 51 mg/dL (ref >39)
LDL Chol Calc (NIH): 112 mg/dL — ABNORMAL HIGH (ref 0–99)
Triglycerides: 99 mg/dL (ref 0–149)
VLDL Cholesterol Cal: 18 mg/dL (ref 5–40)

## 2022-07-20 LAB — COMPREHENSIVE METABOLIC PANEL
ALT: 10 IU/L (ref 0–32)
AST: 17 IU/L (ref 0–40)
Albumin: 4.1 g/dL (ref 3.8–4.8)
Alkaline Phosphatase: 89 IU/L (ref 44–121)
BUN/Creatinine Ratio: 16 (ref 12–28)
BUN: 16 mg/dL (ref 8–27)
Bilirubin Total: 0.3 mg/dL (ref 0.0–1.2)
CO2: 25 mmol/L (ref 20–29)
Calcium: 9.9 mg/dL (ref 8.7–10.3)
Chloride: 103 mmol/L (ref 96–106)
Creatinine, Ser: 1.01 mg/dL — ABNORMAL HIGH (ref 0.57–1.00)
Globulin, Total: 3.1 g/dL (ref 1.5–4.5)
Glucose: 120 mg/dL — ABNORMAL HIGH (ref 70–99)
Potassium: 4.1 mmol/L (ref 3.5–5.2)
Sodium: 142 mmol/L (ref 134–144)
Total Protein: 7.2 g/dL (ref 6.0–8.5)
eGFR: 57 mL/min/{1.73_m2} — ABNORMAL LOW (ref >59)

## 2022-07-20 NOTE — Progress Notes (Signed)
Please let patient know that her cholesterol is elevated.  Her cardiac risk score puts her at high risk of having a stroke or heart attack over the next 10 years.  I recommend that she start a statin called crestor 5mg  daily.  The goal will be to increase this to 20mg  daily if patient tolerates it well.  If she agrees to the medication I can send it to the pharmacy.    Otherwise, her lab work looks good.  No concerns at this time.     The 10-year ASCVD risk score (Arnett DK, et al., 2019) is: 16.3%   Values used to calculate the score:     Age: 77 years     Sex: Female     Is Non-Hispanic African American: Yes     Diabetic: No     Tobacco smoker: No     Systolic Blood Pressure: 138 mmHg     Is BP treated: Yes     HDL Cholesterol: 51 mg/dL     Total Cholesterol: 181 mg/dL

## 2022-07-25 ENCOUNTER — Ambulatory Visit
Admission: RE | Admit: 2022-07-25 | Discharge: 2022-07-25 | Disposition: A | Discharge status: Home / Self Care | Payer: PPO | Attending: Family Medicine | Admitting: Family Medicine

## 2022-07-25 ENCOUNTER — Encounter: Payer: Self-pay | Admitting: Family Medicine

## 2022-07-25 ENCOUNTER — Ambulatory Visit (INDEPENDENT_AMBULATORY_CARE_PROVIDER_SITE_OTHER): Payer: PPO | Admitting: Family Medicine

## 2022-07-25 ENCOUNTER — Ambulatory Visit
Admission: RE | Admit: 2022-07-25 | Discharge: 2022-07-25 | Disposition: A | Discharge status: Home / Self Care | Payer: PPO | Source: Ambulatory Visit | Attending: Family Medicine | Admitting: Family Medicine

## 2022-07-25 ENCOUNTER — Telehealth: Payer: Self-pay | Admitting: Nurse Practitioner

## 2022-07-25 VITALS — BP 126/74 | HR 54 | Temp 98.3°F | Wt 219.6 lb

## 2022-07-25 DIAGNOSIS — M79602 Pain in left arm: Secondary | ICD-10-CM | POA: Diagnosis not present

## 2022-07-25 DIAGNOSIS — M25712 Osteophyte, left shoulder: Secondary | ICD-10-CM | POA: Diagnosis not present

## 2022-07-25 DIAGNOSIS — M19012 Primary osteoarthritis, left shoulder: Secondary | ICD-10-CM | POA: Diagnosis not present

## 2022-07-25 DIAGNOSIS — M25512 Pain in left shoulder: Secondary | ICD-10-CM | POA: Diagnosis not present

## 2022-07-25 MED ORDER — DICLOFENAC SODIUM 1 % EX GEL: 4.0000 g | Freq: Four times a day (QID) | CUTANEOUS | 2 refills | Status: DC

## 2022-07-25 NOTE — Telephone Encounter (Unsigned)
Copied from CRM [id number]. Topic: General - Other >> Jul 25, 2022 11:21 AM Everette C wrote: Reason for CRM: The patient has been directed by their pharmacy to contact their PCP and request an alternative prescription for their diclofenac Sodium (VOLTAREN) 1 % GEL [[id number]]  The patient has been told that their is a delay in their insurance coverage of the medication   Please contact the patient further if needed

## 2022-07-25 NOTE — Telephone Encounter (Signed)
Please let patient know that this can be picked up over the counter. Thanks.

## 2022-07-25 NOTE — Telephone Encounter (Signed)
Is there anything else that can be sent in for the patient?

## 2022-07-25 NOTE — Progress Notes (Signed)
BP 126/74   Pulse (!) 54   Temp 98.3 F (36.8 C) (Oral)   Wt 219 lb 9.6 oz (99.6 kg)   SpO2 99%   BMI 36.54 kg/m    Subjective:    Patient ID: Dawn Gonzalez, female    DOB: [birth year], 77 y.o.   MRN: [id number]  HPI: Dawn Gonzalez is a 77 y.o. female  Chief Complaint  Patient presents with   Arthritis    Pt states she has been having shoulder and hip pain since Sunday    SHOULDER PAIN Duration: 3 days Involved shoulder: left Mechanism of injury: unknown Location: diffuse Onset:sudden Severity: severe  Quality:  sore, throbbing Frequency: constant Radiation: down her L arm Aggravating factors:  cold air Alleviating factors: analgesic cream, heat  Status: stable Treatments attempted: analgesic cream  Relief with NSAIDs?:  significant Weakness: yes Numbness: no Decreased grip strength: no Redness: no Swelling: no Bruising: no Fevers: no   Relevant past medical, surgical, family and social history reviewed and updated as indicated. Interim medical history since our last visit reviewed. Allergies and medications reviewed and updated.  Review of Systems  Constitutional: Negative.   Respiratory: Negative.    Cardiovascular: Negative.   Gastrointestinal: Negative.   Musculoskeletal:  Positive for arthralgias and myalgias. Negative for back pain, gait problem, joint swelling, neck pain and neck stiffness.  Skin: Negative.   Neurological: Negative.   Psychiatric/Behavioral: Negative.      Per HPI unless specifically indicated above     Objective:    BP 126/74   Pulse (!) 54   Temp 98.3 F (36.8 C) (Oral)   Wt 219 lb 9.6 oz (99.6 kg)   SpO2 99%   BMI 36.54 kg/m   Wt Readings from Last 3 Encounters:  07/25/22 219 lb 9.6 oz (99.6 kg)  07/19/22 222 lb 9.6 oz (101 kg)  04/18/22 222 lb 4.8 oz (100.8 kg)    Physical Exam Vitals and nursing note reviewed.  Constitutional:      General: She is not in acute distress.    Appearance: Normal  appearance. She is not ill-appearing, toxic-appearing or diaphoretic.  HENT:     Head: Normocephalic and atraumatic.     Right Ear: External ear normal.     Left Ear: External ear normal.     Nose: Nose normal.     Mouth/Throat:     Mouth: Mucous membranes are moist.     Pharynx: Oropharynx is clear.  Eyes:     General: No scleral icterus.       Right eye: No discharge.        Left eye: No discharge.     Extraocular Movements: Extraocular movements intact.     Conjunctiva/sclera: Conjunctivae normal.     Pupils: Pupils are equal, round, and reactive to light.  Cardiovascular:     Rate and Rhythm: Normal rate and regular rhythm.     Pulses: Normal pulses.     Heart sounds: Normal heart sounds. No murmur heard.    No friction rub. No gallop.  Pulmonary:     Effort: Pulmonary effort is normal. No respiratory distress.     Breath sounds: Normal breath sounds. No stridor. No wheezing, rhonchi or rales.  Chest:     Chest wall: No tenderness.  Musculoskeletal:        General: Normal range of motion.     Cervical back: Normal range of motion and neck supple.  Skin:    General: Skin  is warm and dry.     Capillary Refill: Capillary refill takes less than 2 seconds.     Coloration: Skin is not jaundiced or pale.     Findings: No bruising, erythema, lesion or rash.  Neurological:     General: No focal deficit present.     Mental Status: She is alert and oriented to person, place, and time. Mental status is at baseline.  Psychiatric:        Mood and Affect: Mood normal.        Behavior: Behavior normal.        Thought Content: Thought content normal.        Judgment: Judgment normal.     Results for orders placed or performed in visit on 07/19/22  Comp Met (CMET)  Result Value Ref Range   Glucose 120 (H) 70 - 99 mg/dL   BUN 16 8 - 27 mg/dL   Creatinine, Ser 3.08 (H) 0.57 - 1.00 mg/dL   eGFR 57 (L) >65 HQ/ION/6.29   BUN/Creatinine Ratio 16 12 - 28   Sodium 142 134 - 144 mmol/L    Potassium 4.1 3.5 - 5.2 mmol/L   Chloride 103 96 - 106 mmol/L   CO2 25 20 - 29 mmol/L   Calcium 9.9 8.7 - 10.3 mg/dL   Total Protein 7.2 6.0 - 8.5 g/dL   Albumin 4.1 3.8 - 4.8 g/dL   Globulin, Total 3.1 1.5 - 4.5 g/dL   Bilirubin Total 0.3 0.0 - 1.2 mg/dL   Alkaline Phosphatase 89 44 - 121 IU/L   AST 17 0 - 40 IU/L   ALT 10 0 - 32 IU/L  Lipid Profile  Result Value Ref Range   Cholesterol, Total 181 100 - 199 mg/dL   Triglycerides 99 0 - 149 mg/dL   HDL 51 >52 mg/dL   VLDL Cholesterol Cal 18 5 - 40 mg/dL   LDL Chol Calc (NIH) 841 (H) 0 - 99 mg/dL   Chol/HDL Ratio 3.5 0.0 - 4.4 ratio      Assessment & Plan:   Problem List Items Addressed This Visit   None Visit Diagnoses     Left arm pain    -  Primary   EKG normal. Will check x-ray and treat with voltaren, Call with any concerns or if not getting better. Continue to monitor.   Relevant Orders   EKG 12-Lead (Completed)   DG Shoulder Left        Follow up plan: Return if symptoms worsen or fail to improve.

## 2022-07-26 NOTE — Telephone Encounter (Signed)
Called and notified patient of Dr. Johnson's message.  

## 2022-07-29 NOTE — Progress Notes (Signed)
Interpreted by me 07/25/22. Sinus bradycardia at 58bpm. No ST segment changes

## 2022-08-03 ENCOUNTER — Telehealth: Payer: Self-pay | Admitting: Family Medicine

## 2022-08-03 NOTE — Telephone Encounter (Signed)
Pt. Given imaging results. States she "feels much better and my ROM is better." Will hold off on PT for now.

## 2022-08-21 ENCOUNTER — Encounter: Payer: Self-pay | Admitting: Family Medicine

## 2022-08-21 ENCOUNTER — Ambulatory Visit (INDEPENDENT_AMBULATORY_CARE_PROVIDER_SITE_OTHER): Payer: PPO | Admitting: Family Medicine

## 2022-08-21 VITALS — BP 131/82 | HR 60 | Temp 98.3°F | Wt 223.6 lb

## 2022-08-21 DIAGNOSIS — M25552 Pain in left hip: Secondary | ICD-10-CM | POA: Insufficient documentation

## 2022-08-21 NOTE — Assessment & Plan Note (Addendum)
Acute, ongoing. Kenaog injection offered today, pt declined. Referral sent for orthopedic, pt willing to receive steroid injections as previously. Recommend Tylenol 1000 mg every 6 hours as needed and heat to the area for relief.

## 2022-08-21 NOTE — Progress Notes (Signed)
BP 131/82   Pulse 60   Temp 98.3 F (36.8 C) (Oral)   Wt 223 lb 9.6 oz (101.4 kg)   SpO2 97%   BMI 37.21 kg/m    Subjective:    Patient ID: Dawn Gonzalez, female    DOB: 01-22-1946, 77 y.o.   MRN: [id number]  HPI: Dawn Gonzalez is a 77 y.o. female  Chief Complaint  Patient presents with   Hip Pain    Pt states left hip pain has been off and on for awhile for the last 3 months.   HIP PAIN Three years ago she received a cortisone injection in the left hip for osteoarthritis. She is complaining of sleeping, sitting, and getting out of bed uncomfortably due to pain. Pain is worsened with rain.  Duration:3 months Involved hip: left  Mechanism of injury: unknown Location: posterior Onset: gradual  Severity: 7/10  Quality: aching and stabbing Frequency: intermittent Radiation: yes down below the knee, aching feeling Aggravating factors:  Prolonged standing and walking Alleviating factors:  Tylenol x2 tablets as needed, helps only for a little while, does not like taking a lot of medications  , heat from bath helped Status: worse Treatments attempted:  tylenol , heat, voltaren gel (did not do much) Relief with NSAIDs?: No NSAIDs Taken, previously was taken Aleve but increased BP, stopped taking March 2024.  Weakness with weight bearing: no Weakness with walking: no Paresthesias / decreased sensation: no Swelling: no Redness:no Fevers: no   Relevant past medical, surgical, family and social history reviewed and updated as indicated. Interim medical history since our last visit reviewed. Allergies and medications reviewed and updated.  Review of Systems  Constitutional:  Negative for fever.  Respiratory: Negative.    Cardiovascular: Negative.   Genitourinary:  Negative for dysuria, flank pain and frequency.  Musculoskeletal:  Positive for arthralgias. Negative for back pain, gait problem, joint swelling, myalgias, neck pain and neck stiffness.  Skin:  Negative for  color change.  Neurological:  Negative for numbness.    Per HPI unless specifically indicated above     Objective:    BP 131/82   Pulse 60   Temp 98.3 F (36.8 C) (Oral)   Wt 223 lb 9.6 oz (101.4 kg)   SpO2 97%   BMI 37.21 kg/m   Wt Readings from Last 3 Encounters:  08/21/22 223 lb 9.6 oz (101.4 kg)  07/25/22 219 lb 9.6 oz (99.6 kg)  07/19/22 222 lb 9.6 oz (101 kg)    Physical Exam Vitals and nursing note reviewed.  Constitutional:      General: She is awake. She is not in acute distress.    Appearance: Normal appearance. She is well-developed and well-groomed. She is obese. She is not ill-appearing.  HENT:     Head: Normocephalic and atraumatic.     Right Ear: Hearing and external ear normal. No drainage.     Left Ear: Hearing and external ear normal. No drainage.     Nose: Nose normal.  Eyes:     General: Lids are normal.        Right eye: No discharge.        Left eye: No discharge.     Conjunctiva/sclera: Conjunctivae normal.  Cardiovascular:     Rate and Rhythm: Normal rate and regular rhythm.     Pulses:          Radial pulses are 2+ on the right side and 2+ on the left side.  Popliteal pulses are 2+ on the right side and 2+ on the left side.       Posterior tibial pulses are 2+ on the right side and 2+ on the left side.     Heart sounds: Normal heart sounds, S1 normal and S2 normal. No murmur heard.    No gallop.  Pulmonary:     Effort: Pulmonary effort is normal. No accessory muscle usage or respiratory distress.     Breath sounds: Normal breath sounds.  Musculoskeletal:        General: Normal range of motion.     Cervical back: Full passive range of motion without pain and normal range of motion.     Right hip: No tenderness.     Left hip: Tenderness present.     Right lower leg: No edema.     Left lower leg: No edema.     Comments: Left posterior hip pain with abduction  Skin:    General: Skin is warm and dry.     Capillary Refill: Capillary  refill takes less than 2 seconds.  Neurological:     Mental Status: She is alert and oriented to person, place, and time.  Psychiatric:        Attention and Perception: Attention normal.        Mood and Affect: Mood normal.        Speech: Speech normal.        Behavior: Behavior normal. Behavior is cooperative.        Thought Content: Thought content normal.     Results for orders placed or performed in visit on 07/19/22  Comp Met (CMET)  Result Value Ref Range   Glucose 120 (H) 70 - 99 mg/dL   BUN 16 8 - 27 mg/dL   Creatinine, Ser 1.61 (H) 0.57 - 1.00 mg/dL   eGFR 57 (L) >09 UE/AVW/0.98   BUN/Creatinine Ratio 16 12 - 28   Sodium 142 134 - 144 mmol/L   Potassium 4.1 3.5 - 5.2 mmol/L   Chloride 103 96 - 106 mmol/L   CO2 25 20 - 29 mmol/L   Calcium 9.9 8.7 - 10.3 mg/dL   Total Protein 7.2 6.0 - 8.5 g/dL   Albumin 4.1 3.8 - 4.8 g/dL   Globulin, Total 3.1 1.5 - 4.5 g/dL   Bilirubin Total 0.3 0.0 - 1.2 mg/dL   Alkaline Phosphatase 89 44 - 121 IU/L   AST 17 0 - 40 IU/L   ALT 10 0 - 32 IU/L  Lipid Profile  Result Value Ref Range   Cholesterol, Total 181 100 - 199 mg/dL   Triglycerides 99 0 - 149 mg/dL   HDL 51 >11 mg/dL   VLDL Cholesterol Cal 18 5 - 40 mg/dL   LDL Chol Calc (NIH) 914 (H) 0 - 99 mg/dL   Chol/HDL Ratio 3.5 0.0 - 4.4 ratio      Assessment & Plan:   Problem List Items Addressed This Visit     Left hip pain - Primary    Acute, ongoing. Kenaog injection offered today, pt declined. Referral sent for orthopedic, pt willing to receive steroid injections as previously. Recommend Tylenol 1000 mg every 6 hours as needed and heat to the area for relief.       Relevant Orders   Ambulatory referral to Orthopedic Surgery     Follow up plan: Return if symptoms worsen or fail to improve.

## 2022-08-21 NOTE — Patient Instructions (Addendum)
Continue with heating pad/ warm bath Tylenol 1000 mg every 6 hours, do not take more than 4000 mg in 24 hours.

## 2022-08-24 DIAGNOSIS — M7062 Trochanteric bursitis, left hip: Secondary | ICD-10-CM | POA: Diagnosis not present

## 2022-09-12 DIAGNOSIS — M7062 Trochanteric bursitis, left hip: Secondary | ICD-10-CM | POA: Diagnosis not present

## 2022-09-18 DIAGNOSIS — M7062 Trochanteric bursitis, left hip: Secondary | ICD-10-CM | POA: Diagnosis not present

## 2023-01-01 ENCOUNTER — Ambulatory Visit: Payer: PPO | Admitting: Emergency Medicine

## 2023-01-01 VITALS — Ht 63.5 in | Wt 229.0 lb

## 2023-01-01 DIAGNOSIS — Z0000 Encounter for general adult medical examination without abnormal findings: Secondary | ICD-10-CM

## 2023-01-01 DIAGNOSIS — Z1231 Encounter for screening mammogram for malignant neoplasm of breast: Secondary | ICD-10-CM

## 2023-01-01 DIAGNOSIS — Z780 Asymptomatic menopausal state: Secondary | ICD-10-CM

## 2023-01-01 NOTE — Progress Notes (Signed)
Subjective:   Dawn Gonzalez is a 77 y.o. female who presents for Medicare Annual (Subsequent) preventive examination.  Visit Complete: Virtual I connected with  Alvino Blood on 01/01/23 by a audio enabled telemedicine application and verified that I am speaking with the correct person using two identifiers.  Patient Location: Home  Provider Location: Home Office  I discussed the limitations of evaluation and management by telemedicine. The patient expressed understanding and agreed to proceed.  Vital Signs: Because this visit was a virtual/telehealth visit, some criteria may be missing or patient reported. Any vitals not documented were not able to be obtained and vitals that have been documented are patient reported.   Cardiac Risk Factors include: advanced age (>71men, >88 women);obesity (BMI >30kg/m2);hypertension;dyslipidemia     Objective:    Today's Vitals   01/01/23 1026  Weight: 229 lb (103.9 kg)  Height: 5' 3.5" (1.613 m)  PainSc: 6    Body mass index is 39.93 kg/m.     01/01/2023   10:41 AM 08/12/2019   11:18 AM 04/15/2019    1:36 PM 03/02/2016   11:00 PM 03/02/2016   10:51 AM  Advanced Directives  Does Patient Have a Medical Advance Directive? Yes No Yes No No  Type of Estate agent of [place];Living will  Healthcare Power of Attorney    Does patient want to make changes to medical advance directive? No - Patient declined  No - Patient declined    Copy of Healthcare Power of Attorney in Chart? No - copy requested  No - copy requested    Would patient like information on creating a medical advance directive?  No - Patient declined  No - Patient declined     Current Medications (verified) Outpatient Encounter Medications as of 01/01/2023  Medication Sig   amLODipine (NORVASC) 5 MG tablet TAKE 1 TABLET(5 MG) BY MOUTH DAILY   calcium-vitamin D (OSCAL WITH D) 500-200 MG-UNIT tablet Take 1 tablet by mouth 3 (three) times a week. Takes three  times a week   diclofenac Sodium (VOLTAREN) 1 % GEL Apply 4 g topically 4 (four) times daily.   esomeprazole (NEXIUM) 20 MG capsule TAKE 1 CAPSULE BY MOUTH ONCE DAILY AT NOON   Fluticasone-Umeclidin-Vilant (TRELEGY ELLIPTA) 100-62.5-25 MCG/ACT AEPB Inhale 1 puff into the lungs daily.   hydrochlorothiazide (HYDRODIURIL) 25 MG tablet TAKE 1 TABLET(25 MG) BY MOUTH DAILY   lisinopril (ZESTRIL) 40 MG tablet Take 1 tablet (40 mg total) by mouth daily.   metoprolol succinate (TOPROL-XL) 25 MG 24 hr tablet TAKE 1 TABLET(25 MG) BY MOUTH DAILY   Multiple Vitamin (MULTIVITAMIN) tablet Take 1 tablet by mouth daily.   naproxen sodium (ALEVE) 220 MG tablet Take 220 mg by mouth daily as needed.   budesonide-formoterol (SYMBICORT) 160-4.5 MCG/ACT inhaler Inhale into the lungs. (Patient not taking: Reported on 01/01/2023)   No facility-administered encounter medications on file as of 01/01/2023.    Allergies (verified) Patient has no known allergies.   History: Past Medical History:  Diagnosis Date   Abdominal pain    Acute diarrhea    Allergy    Anemia    Dyspnea    Elevated troponin    History of syncope    Hypertension    Nonrheumatic mitral valve regurgitation    Past Surgical History:  Procedure Laterality Date   ABDOMINAL HYSTERECTOMY     COLONOSCOPY WITH PROPOFOL N/A 08/12/2019   Procedure: COLONOSCOPY WITH PROPOFOL;  Surgeon: Earline Mayotte, MD;  Location: [facility]  ENDOSCOPY;  Service: Endoscopy;  Laterality: N/A;   ESOPHAGOGASTRODUODENOSCOPY (EGD) WITH PROPOFOL N/A 08/12/2019   Procedure: ESOPHAGOGASTRODUODENOSCOPY (EGD) WITH PROPOFOL;  Surgeon: Earline Mayotte, MD;  Location: [facility] ENDOSCOPY;  Service: Endoscopy;  Laterality: N/A;   Family History  Problem Relation Age of Onset   Arthritis Mother    Hypertension Mother    Diabetes Father    Hypertension Father    Heart attack Father 56   Social History   Socioeconomic History   Marital status: Widowed    Spouse name: Not on  file   Number of children: 4   Years of education: Not on file   Highest education level: Not on file  Occupational History   Occupation: retired  Tobacco Use   Smoking status: Never   Smokeless tobacco: Never  Vaping Use   Vaping status: Never Used  Substance and Sexual Activity   Alcohol use: No   Drug use: No   Sexual activity: Not Currently  Other Topics Concern   Not on file  Social History Narrative   3 living adult children, 1 deceased at age 82 (boating accident)   Social Determinants of Health   Financial Resource Strain: Low Risk  (01/01/2023)   Overall Financial Resource Strain (CARDIA)    Difficulty of Paying Living Expenses: Not hard at all  Food Insecurity: No Food Insecurity (01/01/2023)   Hunger Vital Sign    Worried About Running Out of Food in the Last Year: Never true    Ran Out of Food in the Last Year: Never true  Transportation Needs: No Transportation Needs (01/01/2023)   PRAPARE - Administrator, Civil Service (Medical): No    Lack of Transportation (Non-Medical): No  Physical Activity: Insufficiently Active (01/01/2023)   Exercise Vital Sign    Days of Exercise per Week: 3 days    Minutes of Exercise per Session: 30 min  Stress: No Stress Concern Present (01/01/2023)   [employer] of Occupational Health - Occupational Stress Questionnaire    Feeling of Stress : Not at all  Social Connections: Moderately Integrated (01/01/2023)   Social Connection and Isolation Panel [NHANES]    Frequency of Communication with Friends and Family: More than three times a week    Frequency of Social Gatherings with Friends and Family: Twice a week    Attends Religious Services: More than 4 times per year    Active Member of [employer] or Organizations: Yes    Attends Banker Meetings: More than 4 times per year    Marital Status: Widowed    Tobacco Counseling Counseling given: Not Answered   Clinical Intake:  Pre-visit preparation  completed: Yes  Pain : 0-10 Pain Score: 6  Pain Type: Chronic pain Pain Location: Knee Pain Orientation: Left, Right Pain Descriptors / Indicators: Aching     BMI - recorded: 39.93 Nutritional Status: BMI > 30  Obese Nutritional Risks: None Diabetes: No  How often do you need to have someone help you when you read instructions, pamphlets, or other written materials from your doctor or pharmacy?: 1 - Never  Interpreter Needed?: No  Information entered by :: Tora Kindred, CMA   Activities of Daily Living    01/01/2023   10:28 AM  In your present state of health, do you have any difficulty performing the following activities:  Hearing? 0  Vision? 0  Difficulty concentrating or making decisions? 0  Walking or climbing stairs? 1  Comment due to arthritis in knees  Dressing or bathing? 0  Doing errands, shopping? 0  Preparing Food and eating ? N  Using the Toilet? N  In the past six months, have you accidently leaked urine? N  Do you have problems with loss of bowel control? N  Managing your Medications? N  Managing your Finances? N  Housekeeping or managing your Housekeeping? N    Patient Care Team: Larae Grooms, NP as PCP - General (Nurse Practitioner)  Indicate any recent Medical Services you may have received from other than [facility] providers in the past year (date may be approximate).     Assessment:   This is a routine wellness examination for [place].  Hearing/Vision screen Hearing Screening - Comments:: Denies hearing loss Vision Screening - Comments:: Gets eye exams   Goals Addressed               This Visit's Progress     Weight (lb) < 175 lb (79.4 kg) (pt-stated)   229 lb (103.9 kg)     Depression Screen    01/01/2023   10:37 AM 08/21/2022    8:37 AM 07/25/2022    8:59 AM 07/19/2022    9:04 AM 04/18/2022    1:57 PM 03/20/2022   10:12 AM 02/06/2022   10:35 AM  PHQ 2/9 Scores  PHQ - 2 Score 0 0 1 0 0 0 1  PHQ- 9 Score 0 5 3 5 4 5 5     Fall  Risk    01/01/2023   10:41 AM 08/21/2022    8:37 AM 07/25/2022    8:59 AM 07/19/2022    9:04 AM 04/18/2022    1:57 PM  Fall Risk   Falls in the past year? 0 0 0 0 0  Number falls in past yr: 0 0 0 0 0  Injury with Fall? 0 0 0 0 0  Risk for fall due to : No Fall Risks No Fall Risks No Fall Risks No Fall Risks No Fall Risks  Follow up Falls prevention discussed Falls evaluation completed Falls evaluation completed Falls evaluation completed Falls evaluation completed    MEDICARE RISK AT HOME: Medicare Risk at Home Any stairs in or around the home?: Yes If so, are there any without handrails?: No Home free of loose throw rugs in walkways, pet beds, electrical cords, etc?: Yes Adequate lighting in your home to reduce risk of falls?: Yes Life alert?: No Use of a cane, walker or w/c?: No Grab bars in the bathroom?: Yes Shower chair or bench in shower?: No Elevated toilet seat or a handicapped toilet?: Yes  TIMED UP AND GO:  Was the test performed?  No    Cognitive Function:        01/01/2023   10:43 AM  6CIT Screen  What Year? 0 points  What month? 0 points  What time? 0 points  Count back from 20 0 points  Months in reverse 0 points  Repeat phrase 0 points  Total Score 0 points    Immunizations Immunization History  Administered Date(s) Administered   PFIZER(Purple Top)SARS-COV-2 Vaccination 04/20/2019, 05/11/2019, 12/21/2019, 09/05/2020   PNEUMOCOCCAL CONJUGATE-20 12/07/2021    TDAP status: Due, Education has been provided regarding the importance of this vaccine. Advised may receive this vaccine at local pharmacy or Health Dept. Aware to provide a copy of the vaccination record if obtained from local pharmacy or Health Dept. Verbalized acceptance and understanding.  Flu Vaccine status: Declined, Education has been provided regarding the importance of this vaccine but  patient still declined. Advised may receive this vaccine at local pharmacy or Health Dept. Aware to  provide a copy of the vaccination record if obtained from local pharmacy or Health Dept. Verbalized acceptance and understanding. Patient allergic  Pneumococcal vaccine status: Up to date  Covid-19 vaccine status: Information provided on how to obtain vaccines.   Qualifies for Shingles Vaccine? Yes   Zostavax completed No   Shingrix Completed?: No.    Education has been provided regarding the importance of this vaccine. Patient has been advised to call insurance company to determine out of pocket expense if they have not yet received this vaccine. Advised may also receive vaccine at local pharmacy or Health Dept. Verbalized acceptance and understanding.  Screening Tests Health Maintenance  Topic Date Due   Hepatitis C Screening  Never done   DTaP/Tdap/Td (1 - Tdap) Never done   Zoster Vaccines- Shingrix (1 of 2) Never done   DEXA SCAN  Never done   INFLUENZA VACCINE  Never done   COVID-19 Vaccine (5 - 2023-24 season) 09/30/2022   Medicare Annual Wellness (AWV)  01/01/2024   Pneumonia Vaccine 47+ Years old  Completed   HPV VACCINES  Aged Out   Colonoscopy  Discontinued    Health Maintenance  Health Maintenance Due  Topic Date Due   Hepatitis C Screening  Never done   DTaP/Tdap/Td (1 - Tdap) Never done   Zoster Vaccines- Shingrix (1 of 2) Never done   DEXA SCAN  Never done   INFLUENZA VACCINE  Never done   COVID-19 Vaccine (5 - 2023-24 season) 09/30/2022    Colorectal cancer screening: No longer required.   Mammogram status: Ordered 01/01/23. Pt provided with contact info and advised to call to schedule appt.   Bone Density status: Ordered 01/01/23. Pt provided with contact info and advised to call to schedule appt.  Lung Cancer Screening: (Low Dose CT Chest recommended if Age 62-80 years, 20 pack-year currently smoking OR have quit w/in 15years.) does not qualify.   Lung Cancer Screening Referral: n/a  Additional Screening:  Hepatitis C Screening: does qualify; Patient  declined  Vision Screening: Recommended annual ophthalmology exams for early detection of glaucoma and other disorders of the eye.  Dental Screening: Recommended annual dental exams for proper oral hygiene   Community Resource Referral / Chronic Care Management: CRR required this visit?  No   CCM required this visit?  No     Plan:     I have personally reviewed and noted the following in the patient's chart:   Medical and social history Use of alcohol, tobacco or illicit drugs  Current medications and supplements including opioid prescriptions. Patient is not currently taking opioid prescriptions. Functional ability and status Nutritional status Physical activity Advanced directives List of other physicians Hospitalizations, surgeries, and ER visits in previous 12 months Vitals Screenings to include cognitive, depression, and falls Referrals and appointments  In addition, I have reviewed and discussed with patient certain preventive protocols, quality metrics, and best practice recommendations. A written personalized care plan for preventive services as well as general preventive health recommendations were provided to patient.     Tora Kindred, CMA   01/01/2023   After Visit Summary: (MyChart) Due to this being a telephonic visit, the after visit summary with patients personalized plan was offered to patient via MyChart   Nurse Notes:  Placed orders for DEXA and MMG Needs Tdap, Covid and shingles vaccines. Patient states she had a severe reaction to the flu  vaccine. (Sob and very high fever). I have added this to her drug allergies.

## 2023-01-01 NOTE — Patient Instructions (Addendum)
Dawn Gonzalez , Thank you for taking time to come for your Medicare Wellness Visit. I appreciate your ongoing commitment to your health goals. Please review the following plan we discussed and let me know if I can assist you in the future.   Referrals/Orders/Follow-Ups/Clinician Recommendations:   Recommend getting the tetanus, covid and shingles vaccines at your earliest convenience. I have placed orders for a mammogram and bone density test. Call [facility] [address] [phone] to schedule at your earliest convenience. Call [facility] @ [phone] to schedule your next eye exam.  This is a list of the screening recommended for you and due dates:  Health Maintenance  Topic Date Due   Hepatitis C Screening  Never done   DTaP/Tdap/Td vaccine (1 - Tdap) Never done   Zoster (Shingles) Vaccine (1 of 2) Never done   DEXA scan (bone density measurement)  Never done   COVID-19 Vaccine (6 - 2023-24 season) 09/30/2022   Flu Shot  04/29/2023*   Medicare Annual Wellness Visit  01/01/2024   Pneumonia Vaccine  Completed   HPV Vaccine  Aged Out   Colon Cancer Screening  Discontinued  *Topic was postponed. The date shown is not the original due date.    Advanced directives: (Copy Requested) Please bring a copy of your health care power of attorney and living will to the office to be added to your chart at your convenience.  Next Medicare Annual Wellness Visit scheduled for next year: Yes, 01/07/24 @ 8:40am

## 2023-01-18 ENCOUNTER — Encounter: Payer: PPO | Admitting: Nurse Practitioner

## 2023-02-06 ENCOUNTER — Ambulatory Visit: Payer: PPO | Admitting: Nurse Practitioner

## 2023-02-06 ENCOUNTER — Encounter: Payer: Self-pay | Admitting: Nurse Practitioner

## 2023-02-06 VITALS — BP 135/75 | HR 71 | Temp 98.5°F | Ht 64.5 in | Wt 230.0 lb

## 2023-02-06 DIAGNOSIS — E7800 Pure hypercholesterolemia, unspecified: Secondary | ICD-10-CM | POA: Diagnosis not present

## 2023-02-06 DIAGNOSIS — Z0000 Encounter for general adult medical examination without abnormal findings: Secondary | ICD-10-CM | POA: Diagnosis not present

## 2023-02-06 DIAGNOSIS — I10 Essential (primary) hypertension: Secondary | ICD-10-CM

## 2023-02-06 DIAGNOSIS — R0602 Shortness of breath: Secondary | ICD-10-CM

## 2023-02-06 DIAGNOSIS — D509 Iron deficiency anemia, unspecified: Secondary | ICD-10-CM | POA: Diagnosis not present

## 2023-02-06 LAB — URINALYSIS, ROUTINE W REFLEX MICROSCOPIC
Bilirubin, UA: NEGATIVE
Glucose, UA: NEGATIVE
Ketones, UA: NEGATIVE
Leukocytes,UA: NEGATIVE
Nitrite, UA: NEGATIVE
Protein,UA: NEGATIVE
RBC, UA: NEGATIVE
Specific Gravity, UA: 1.02 (ref 1.005–1.030)
Urobilinogen, Ur: 0.2 mg/dL (ref 0.2–1.0)
pH, UA: 6 (ref 5.0–7.5)

## 2023-02-06 MED ORDER — AMLODIPINE BESYLATE 5 MG PO TABS: ORAL_TABLET | ORAL | 1 refills | Status: DC

## 2023-02-06 MED ORDER — FLUTICASONE FUROATE-VILANTEROL 200-25 MCG/ACT IN AEPB: 1.0000 | INHALATION_SPRAY | Freq: Every day | RESPIRATORY_TRACT | 11 refills | Status: DC

## 2023-02-06 MED ORDER — METOPROLOL SUCCINATE ER 25 MG PO TB24: ORAL_TABLET | ORAL | 1 refills | Status: DC

## 2023-02-06 MED ORDER — HYDROCHLOROTHIAZIDE 25 MG PO TABS: 25.0000 mg | ORAL_TABLET | Freq: Every day | ORAL | 1 refills | Status: DC

## 2023-02-06 MED ORDER — ESOMEPRAZOLE MAGNESIUM 20 MG PO CPDR: 20.0000 mg | DELAYED_RELEASE_CAPSULE | Freq: Every day | ORAL | 1 refills | Status: DC

## 2023-02-06 MED ORDER — LISINOPRIL 40 MG PO TABS: 40.0000 mg | ORAL_TABLET | Freq: Every day | ORAL | 1 refills | Status: DC

## 2023-02-06 NOTE — Progress Notes (Signed)
 BP 135/75 (BP Location: Right Arm, Patient Position: Sitting, Cuff Size: Large)   Pulse 71   Temp 98.5 F (36.9 C) (Oral)   Ht 5' 4.5 (1.638 m)   Wt 230 lb (104.3 kg)   SpO2 97%   BMI 38.87 kg/m    Subjective:    Patient ID: Dawn Gonzalez, female    DOB: 10-07-1945, 78 y.o.   MRN: [id number]  HPI: Dawn Gonzalez is a 78 y.o. female presenting on 02/06/2023 for comprehensive medical examination. Current medical complaints include:none  She currently lives with: Menopausal Symptoms: no  HYPERTENSION without Chronic Kidney Disease Hypertension status: controlled  Satisfied with current treatment? yes Duration of hypertension: years BP monitoring frequency:  daily BP range: 130/60 BP medication side effects:  no Medication compliance: excellent compliance Previous BP meds:lisinopril , hydrochlorothiazide , and amlodipine  Aspirin : no Recurrent headaches: no Visual changes: no Palpitations: no Dyspnea: yes Chest pain: no Lower extremity edema: no Dizzy/lightheaded: no  Depression Screen done today and results listed below:     02/06/2023    2:47 PM 01/01/2023   10:37 AM 08/21/2022    8:37 AM 07/25/2022    8:59 AM 07/19/2022    9:04 AM  Depression screen PHQ 2/9  Decreased Interest 0 0 0 1 0  Down, Depressed, Hopeless 0 0 0 0 0  PHQ - 2 Score 0 0 0 1 0  Altered sleeping 2 0 2 2 3   Tired, decreased energy 1 0 1 0 2  Change in appetite 2 0 2 0 0  Feeling bad or failure about yourself  0 0 0 0 0  Trouble concentrating 0 0 0 0 0  Moving slowly or fidgety/restless 0 0 0 0 0  Suicidal thoughts 0 0 0 0 0  PHQ-9 Score 5 0 5 3 5   Difficult doing work/chores  Not difficult at all Somewhat difficult Somewhat difficult Somewhat difficult    The patient does not have a history of falls. I did complete a risk assessment for falls. A plan of care for falls was documented.   Past Medical History:  Past Medical History:  Diagnosis Date   Abdominal pain    Acute diarrhea     Allergy    Anemia    Dyspnea    Elevated troponin    History of syncope    Hypertension    Nonrheumatic mitral valve regurgitation     Surgical History:  Past Surgical History:  Procedure Laterality Date   ABDOMINAL HYSTERECTOMY     COLONOSCOPY WITH PROPOFOL  N/A 08/12/2019   Procedure: COLONOSCOPY WITH PROPOFOL ;  Surgeon: Dessa Reyes ORN, MD;  Location: [facility];  Service: Endoscopy;  Laterality: N/A;   ESOPHAGOGASTRODUODENOSCOPY (EGD) WITH PROPOFOL  N/A 08/12/2019   Procedure: ESOPHAGOGASTRODUODENOSCOPY (EGD) WITH PROPOFOL ;  Surgeon: Dessa Reyes ORN, MD;  Location: [facility];  Service: Endoscopy;  Laterality: N/A;    Medications:  Current Outpatient Medications on File Prior to Visit  Medication Sig   calcium-vitamin D (OSCAL WITH D) 500-200 MG-UNIT tablet Take 1 tablet by mouth 3 (three) times a week. Takes three times a week   diclofenac  Sodium (VOLTAREN ) 1 % GEL Apply 4 g topically 4 (four) times daily.   Multiple Vitamin (MULTIVITAMIN) tablet Take 1 tablet by mouth daily.   naproxen sodium (ALEVE) 220 MG tablet Take 220 mg by mouth daily as needed.   No current facility-administered medications on file prior to visit.    Allergies:  Allergies  Allergen Reactions   Fluogen Shira  Virus Vaccine] Other (See Comments)    Very high fever, sob    Social History:  Social History   Socioeconomic History   Marital status: Widowed    Spouse name: Not on file   Number of children: 4   Years of education: Not on file   Highest education level: Not on file  Occupational History   Occupation: retired  Tobacco Use   Smoking status: Never   Smokeless tobacco: Never  Vaping Use   Vaping status: Never Used  Substance and Sexual Activity   Alcohol use: No   Drug use: No   Sexual activity: Not Currently  Other Topics Concern   Not on file  Social History Narrative   3 living adult children, 1 deceased at age 68 (boating accident)   Social Drivers of  Corporate Investment Banker Strain: Low Risk  (01/01/2023)   Overall Financial Resource Strain (CARDIA)    Difficulty of Paying Living Expenses: Not hard at all  Food Insecurity: No Food Insecurity (01/01/2023)   Hunger Vital Sign    Worried About Running Out of Food in the Last Year: Never true    Ran Out of Food in the Last Year: Never true  Transportation Needs: No Transportation Needs (01/01/2023)   PRAPARE - Administrator, Civil Service (Medical): No    Lack of Transportation (Non-Medical): No  Physical Activity: Insufficiently Active (01/01/2023)   Exercise Vital Sign    Days of Exercise per Week: 3 days    Minutes of Exercise per Session: 30 min  Stress: No Stress Concern Present (01/01/2023)   [employer] of Occupational Health - Occupational Stress Questionnaire    Feeling of Stress : Not at all  Social Connections: Moderately Integrated (01/01/2023)   Social Connection and Isolation Panel [NHANES]    Frequency of Communication with Friends and Family: More than three times a week    Frequency of Social Gatherings with Friends and Family: Twice a week    Attends Religious Services: More than 4 times per year    Active Member of [employer] or Organizations: Yes    Attends Banker Meetings: More than 4 times per year    Marital Status: Widowed  Intimate Partner Violence: Not At Risk (01/01/2023)   Humiliation, Afraid, Rape, and Kick questionnaire    Fear of Current or Ex-Partner: No    Emotionally Abused: No    Physically Abused: No    Sexually Abused: No   Social History   Tobacco Use  Smoking Status Never  Smokeless Tobacco Never   Social History   Substance and Sexual Activity  Alcohol Use No    Family History:  Family History  Problem Relation Age of Onset   Arthritis Mother    Hypertension Mother    Diabetes Father    Hypertension Father    Heart attack Father 61    Past medical history, surgical history, medications, allergies,  family history and social history reviewed with patient today and changes made to appropriate areas of the chart.   Review of Systems  Eyes:  Negative for blurred vision and double vision.  Respiratory:  Positive for shortness of breath.   Cardiovascular:  Negative for chest pain, palpitations and leg swelling.  Neurological:  Negative for dizziness and headaches.   All other ROS negative except what is listed above and in the HPI.      Objective:    BP 135/75 (BP Location: Right Arm, Patient Position:  Sitting, Cuff Size: Large)   Pulse 71   Temp 98.5 F (36.9 C) (Oral)   Ht 5' 4.5 (1.638 m)   Wt 230 lb (104.3 kg)   SpO2 97%   BMI 38.87 kg/m   Wt Readings from Last 3 Encounters:  02/06/23 230 lb (104.3 kg)  01/01/23 229 lb (103.9 kg)  08/21/22 223 lb 9.6 oz (101.4 kg)    Physical Exam Vitals and nursing note reviewed.  Constitutional:      General: She is awake. She is not in acute distress.    Appearance: Normal appearance. She is well-developed. She is obese. She is not ill-appearing.  HENT:     Head: Normocephalic and atraumatic.     Right Ear: Hearing, tympanic membrane, ear canal and external ear normal. No drainage.     Left Ear: Hearing, tympanic membrane, ear canal and external ear normal. No drainage.     Nose: Nose normal.     Right Sinus: No maxillary sinus tenderness or frontal sinus tenderness.     Left Sinus: No maxillary sinus tenderness or frontal sinus tenderness.     Mouth/Throat:     Mouth: Mucous membranes are moist.     Pharynx: Oropharynx is clear. Uvula midline. No pharyngeal swelling, oropharyngeal exudate or posterior oropharyngeal erythema.  Eyes:     General: Lids are normal.        Right eye: No discharge.        Left eye: No discharge.     Extraocular Movements: Extraocular movements intact.     Conjunctiva/sclera: Conjunctivae normal.     Pupils: Pupils are equal, round, and reactive to light.     Visual Fields: Right eye visual fields  normal and left eye visual fields normal.  Neck:     Thyroid : No thyromegaly.     Vascular: No carotid bruit.     Trachea: Trachea normal.  Cardiovascular:     Rate and Rhythm: Normal rate and regular rhythm.     Heart sounds: Normal heart sounds. No murmur heard.    No gallop.  Pulmonary:     Effort: Pulmonary effort is normal. No accessory muscle usage or respiratory distress.     Breath sounds: Normal breath sounds.  Chest:  Breasts:    Right: Normal.     Left: Normal.  Abdominal:     General: Bowel sounds are normal.     Palpations: Abdomen is soft. There is no hepatomegaly or splenomegaly.     Tenderness: There is no abdominal tenderness.  Musculoskeletal:        General: Normal range of motion.     Cervical back: Normal range of motion and neck supple.     Right lower leg: No edema.     Left lower leg: No edema.  Lymphadenopathy:     Head:     Right side of head: No submental, submandibular, tonsillar, preauricular or posterior auricular adenopathy.     Left side of head: No submental, submandibular, tonsillar, preauricular or posterior auricular adenopathy.     Cervical: No cervical adenopathy.     Upper Body:     Right upper body: No supraclavicular, axillary or pectoral adenopathy.     Left upper body: No supraclavicular, axillary or pectoral adenopathy.  Skin:    General: Skin is warm and dry.     Capillary Refill: Capillary refill takes less than 2 seconds.     Findings: No rash.  Neurological:     Mental Status: She is  alert and oriented to person, place, and time.     Gait: Gait is intact.  Psychiatric:        Attention and Perception: Attention normal.        Mood and Affect: Mood normal.        Speech: Speech normal.        Behavior: Behavior normal. Behavior is cooperative.        Thought Content: Thought content normal.        Judgment: Judgment normal.     Results for orders placed or performed in visit on 07/19/22  Comp Met (CMET)   Collection  Time: 07/19/22  9:16 AM  Result Value Ref Range   Glucose 120 (H) 70 - 99 mg/dL   BUN 16 8 - 27 mg/dL   Creatinine, Ser 8.98 (H) 0.57 - 1.00 mg/dL   eGFR 57 (L) >40 fO/fpw/8.26   BUN/Creatinine Ratio 16 12 - 28   Sodium 142 134 - 144 mmol/L   Potassium 4.1 3.5 - 5.2 mmol/L   Chloride 103 96 - 106 mmol/L   CO2 25 20 - 29 mmol/L   Calcium 9.9 8.7 - 10.3 mg/dL   Total Protein 7.2 6.0 - 8.5 g/dL   Albumin 4.1 3.8 - 4.8 g/dL   Globulin, Total 3.1 1.5 - 4.5 g/dL   Bilirubin Total 0.3 0.0 - 1.2 mg/dL   Alkaline Phosphatase 89 44 - 121 IU/L   AST 17 0 - 40 IU/L   ALT 10 0 - 32 IU/L  Lipid Profile   Collection Time: 07/19/22  9:16 AM  Result Value Ref Range   Cholesterol, Total 181 100 - 199 mg/dL   Triglycerides 99 0 - 149 mg/dL   HDL 51 >60 mg/dL   VLDL Cholesterol Cal 18 5 - 40 mg/dL   LDL Chol Calc (NIH) 887 (H) 0 - 99 mg/dL   Chol/HDL Ratio 3.5 0.0 - 4.4 ratio      Assessment & Plan:   Problem List Items Addressed This Visit       Cardiovascular and Mediastinum   Essential hypertension   Chronic.  Controlled.  Continue with current medication regimen of Lisinopril , HCTZ and Amlodipine .  Refills sent today.  Continue to check blood pressures at home. Labs ordered today.  Return to clinic in 6 months for reevaluation.  Call sooner if concerns arise.       Relevant Medications   amLODipine  (NORVASC ) 5 MG tablet   hydrochlorothiazide  (HYDRODIURIL ) 25 MG tablet   lisinopril  (ZESTRIL ) 40 MG tablet   metoprolol  succinate (TOPROL -XL) 25 MG 24 hr tablet     Other   Anemia, iron deficiency   Labs ordered at visit today.  Will make recommendations based on lab results.        Relevant Orders   CBC with Differential/Platelet   Iron, TIBC and Ferritin Panel   SOB (shortness of breath) on exertion   Was previously on Symbicort  but insurance stopped covering the medication.  IT was changed to Trellegy by patient didn't feel like it was helping.  Will change to [facility]. Would benefit  from Spirometry.        Elevated LDL cholesterol level   Labs ordered at visit today.  Will make recommendations based on lab results.       Relevant Orders   Lipid panel   Other Visit Diagnoses       Annual physical exam    -  Primary   Health mainttenance reivewe during visit  today.  Labs ordered.  Vaccines reviewed.   Relevant Orders   CBC with Differential/Platelet   Comprehensive metabolic panel   Lipid panel   TSH   Urinalysis, Routine w reflex microscopic   Iron, TIBC and Ferritin Panel        Follow up plan: Return in about 6 months (around 08/06/2023) for HTN, HLD, DM2 FU.   LABORATORY TESTING:  - Pap smear: not applicable  IMMUNIZATIONS:   - Tdap: Tetanus vaccination status reviewed: Medicare. - Influenza:  NA - Pneumovax: Up to date - Prevnar: Up to date - COVID: Up to date - HPV: Not applicable - Shingrix vaccine: Up to date  SCREENING: -Mammogram: Not applicable  - Colonoscopy: Not applicable  - Bone Density: Not applicable  -Hearing Test: Not applicable  -Spirometry: Not applicable   PATIENT COUNSELING:   Advised to take 1 mg of folate supplement per day if capable of pregnancy.   Sexuality: Discussed sexually transmitted diseases, partner selection, use of condoms, avoidance of unintended pregnancy  and contraceptive alternatives.   Advised to avoid cigarette smoking.  I discussed with the patient that most people either abstain from alcohol or drink within safe limits (<=14/week and <=4 drinks/occasion for males, <=7/weeks and <= 3 drinks/occasion for females) and that the risk for alcohol disorders and other health effects rises proportionally with the number of drinks per week and how often a drinker exceeds daily limits.  Discussed cessation/primary prevention of drug use and availability of treatment for abuse.   Diet: Encouraged to adjust caloric intake to maintain  or achieve ideal body weight, to reduce intake of dietary saturated fat  and total fat, to limit sodium intake by avoiding high sodium foods and not adding table salt, and to maintain adequate dietary potassium and calcium preferably from fresh fruits, vegetables, and low-fat dairy products.    stressed the importance of regular exercise  Injury prevention: Discussed safety belts, safety helmets, smoke detector, smoking near bedding or upholstery.   Dental health: Discussed importance of regular tooth brushing, flossing, and dental visits.    NEXT PREVENTATIVE PHYSICAL DUE IN 1 YEAR. Return in about 6 months (around 08/06/2023) for HTN, HLD, DM2 FU.

## 2023-02-06 NOTE — Assessment & Plan Note (Signed)
 Labs ordered at visit today.  Will make recommendations based on lab results.

## 2023-02-06 NOTE — Assessment & Plan Note (Signed)
 Was previously on Symbicort but insurance stopped covering the medication.  IT was changed to Trellegy by patient didn't feel like it was helping.  Will change to [facility]. Would benefit from Spirometry.

## 2023-02-06 NOTE — Assessment & Plan Note (Signed)
Chronic.  Controlled.  Continue with current medication regimen of Lisinopril, HCTZ and Amlodipine.  Refills sent today.  Continue to check blood pressures at home. Labs ordered today.  Return to clinic in 6 months for reevaluation.  Call sooner if concerns arise.

## 2023-02-07 LAB — CBC WITH DIFFERENTIAL/PLATELET
Basophils Absolute: 0.1 10*3/uL (ref 0.0–0.2)
Basos: 1 %
EOS (ABSOLUTE): 0.4 10*3/uL (ref 0.0–0.4)
Eos: 4 %
Hematocrit: 42.3 % (ref 34.0–46.6)
Hemoglobin: 13.2 g/dL (ref 11.1–15.9)
Immature Grans (Abs): 0 10*3/uL (ref 0.0–0.1)
Immature Granulocytes: 0 %
Lymphocytes Absolute: 2.2 10*3/uL (ref 0.7–3.1)
Lymphs: 23 %
MCH: 26.8 pg (ref 26.6–33.0)
MCHC: 31.2 g/dL — ABNORMAL LOW (ref 31.5–35.7)
MCV: 86 fL (ref 79–97)
Monocytes Absolute: 0.6 10*3/uL (ref 0.1–0.9)
Monocytes: 7 %
Neutrophils Absolute: 6.2 10*3/uL (ref 1.4–7.0)
Neutrophils: 65 %
Platelets: 243 10*3/uL (ref 150–450)
RBC: 4.92 x10E6/uL (ref 3.77–5.28)
RDW: 13.1 % (ref 11.7–15.4)
WBC: 9.5 10*3/uL (ref 3.4–10.8)

## 2023-02-07 LAB — IRON,TIBC AND FERRITIN PANEL
Ferritin: 42 ng/mL (ref 15–150)
Iron Saturation: 12 % — ABNORMAL LOW (ref 15–55)
Iron: 45 ug/dL (ref 27–139)
Total Iron Binding Capacity: 368 ug/dL (ref 250–450)
UIBC: 323 ug/dL (ref 118–369)

## 2023-02-07 LAB — COMPREHENSIVE METABOLIC PANEL
ALT: 9 [IU]/L (ref 0–32)
AST: 16 [IU]/L (ref 0–40)
Albumin: 4.1 g/dL (ref 3.8–4.8)
Alkaline Phosphatase: 96 [IU]/L (ref 44–121)
BUN/Creatinine Ratio: 13 (ref 12–28)
BUN: 13 mg/dL (ref 8–27)
Bilirubin Total: 0.3 mg/dL (ref 0.0–1.2)
CO2: 23 mmol/L (ref 20–29)
Calcium: 9.5 mg/dL (ref 8.7–10.3)
Chloride: 100 mmol/L (ref 96–106)
Creatinine, Ser: 1 mg/dL (ref 0.57–1.00)
Globulin, Total: 3.4 g/dL (ref 1.5–4.5)
Glucose: 91 mg/dL (ref 70–99)
Potassium: 4.6 mmol/L (ref 3.5–5.2)
Sodium: 141 mmol/L (ref 134–144)
Total Protein: 7.5 g/dL (ref 6.0–8.5)
eGFR: 58 mL/min/{1.73_m2} — ABNORMAL LOW (ref >59)

## 2023-02-07 LAB — LIPID PANEL
Chol/HDL Ratio: 4 {ratio} (ref 0.0–4.4)
Cholesterol, Total: 199 mg/dL (ref 100–199)
HDL: 50 mg/dL (ref >39)
LDL Chol Calc (NIH): 129 mg/dL — ABNORMAL HIGH (ref 0–99)
Triglycerides: 113 mg/dL (ref 0–149)
VLDL Cholesterol Cal: 20 mg/dL (ref 5–40)

## 2023-02-07 LAB — TSH: TSH: 1.89 u[IU]/mL (ref 0.450–4.500)

## 2023-03-18 ENCOUNTER — Telehealth: Payer: PPO | Admitting: Nurse Practitioner

## 2023-03-18 ENCOUNTER — Encounter: Payer: Self-pay | Admitting: Nurse Practitioner

## 2023-03-18 DIAGNOSIS — R051 Acute cough: Secondary | ICD-10-CM | POA: Diagnosis not present

## 2023-03-18 MED ORDER — AZITHROMYCIN 250 MG PO TABS: ORAL_TABLET | ORAL | 0 refills | Status: AC

## 2023-03-18 MED ORDER — METHYLPREDNISOLONE 4 MG PO TBPK: ORAL_TABLET | ORAL | 0 refills | Status: DC

## 2023-03-18 NOTE — Progress Notes (Signed)
 There were no vitals taken for this visit.   Subjective:    Patient ID: Dawn Gonzalez, female    DOB: [birth year], 78 y.o.   MRN: [id number]  HPI: Dawn Gonzalez is a 78 y.o. female  Chief Complaint  Patient presents with   Cough    Heavy Coughing w/ clear white mucus, sinus drainage, worse in the morning-5 days. Tried inhaler and nasonex. Denied fever/pain.   UPPER RESPIRATORY TRACT INFECTION Worst symptom: symptoms have been ongoing x 5 days.  Fever: no Cough: yes Shortness of breath: no Wheezing: yes Chest pain: yes, with cough Chest tightness: yes Chest congestion: yes Nasal congestion: no Runny nose: yes Post nasal drip: yes Sneezing: yes Sore throat: no Swollen glands: no Sinus pressure: no Headache: no Face pain: no Toothache: no Ear pain: no bilateral Ear pressure: no bilateral Eyes red/itching:no Eye drainage/crusting: no  Vomiting: no Rash: no Fatigue: yes Sick contacts: no Strep contacts: no  Context: stable Recurrent sinusitis: no Relief with OTC cold/cough medications: yes  Treatments attempted: mucinex   Relevant past medical, surgical, family and social history reviewed and updated as indicated. Interim medical history since our last visit reviewed. Allergies and medications reviewed and updated.  Review of Systems  Constitutional:  Positive for fatigue. Negative for fever.  HENT:  Positive for postnasal drip, rhinorrhea and sneezing. Negative for congestion, dental problem, ear pain, sinus pressure, sinus pain and sore throat.   Respiratory:  Positive for cough, chest tightness and wheezing. Negative for shortness of breath.   Cardiovascular:  Positive for chest pain.  Gastrointestinal:  Negative for vomiting.  Skin:  Negative for rash.  Neurological:  Negative for headaches.    Per HPI unless specifically indicated above     Objective:    There were no vitals taken for this visit.  Wt Readings from Last 3 Encounters:  02/06/23  230 lb (104.3 kg)  01/01/23 229 lb (103.9 kg)  08/21/22 223 lb 9.6 oz (101.4 kg)    Physical Exam Vitals and nursing note reviewed.  HENT:     Head: Normocephalic.     Right Ear: Hearing normal.     Left Ear: Hearing normal.     Nose: Nose normal.  Eyes:     Pupils: Pupils are equal, round, and reactive to light.  Pulmonary:     Effort: Pulmonary effort is normal. No respiratory distress.  Neurological:     Mental Status: She is alert.  Psychiatric:        Mood and Affect: Mood normal.        Behavior: Behavior normal.        Thought Content: Thought content normal.        Judgment: Judgment normal.     Results for orders placed or performed in visit on 02/06/23  Urinalysis, Routine w reflex microscopic   Collection Time: 02/06/23  3:02 PM  Result Value Ref Range   Specific Gravity, UA 1.020 1.005 - 1.030   pH, UA 6.0 5.0 - 7.5   Color, UA Yellow Yellow   Appearance Ur Clear Clear   Leukocytes,UA Negative Negative   Protein,UA Negative Negative/Trace   Glucose, UA Negative Negative   Ketones, UA Negative Negative   RBC, UA Negative Negative   Bilirubin, UA Negative Negative   Urobilinogen, Ur 0.2 0.2 - 1.0 mg/dL   Nitrite, UA Negative Negative   Microscopic Examination Comment   CBC with Differential/Platelet   Collection Time: 02/06/23  3:03 PM  Result  Value Ref Range   WBC 9.5 3.4 - 10.8 x10E3/uL   RBC 4.92 3.77 - 5.28 x10E6/uL   Hemoglobin 13.2 11.1 - 15.9 g/dL   Hematocrit 44.0 10.2 - 46.6 %   MCV 86 79 - 97 fL   MCH 26.8 26.6 - 33.0 pg   MCHC 31.2 (L) 31.5 - 35.7 g/dL   RDW 72.5 36.6 - 44.0 %   Platelets 243 150 - 450 x10E3/uL   Neutrophils 65 Not Estab. %   Lymphs 23 Not Estab. %   Monocytes 7 Not Estab. %   Eos 4 Not Estab. %   Basos 1 Not Estab. %   Neutrophils Absolute 6.2 1.4 - 7.0 x10E3/uL   Lymphocytes Absolute 2.2 0.7 - 3.1 x10E3/uL   Monocytes Absolute 0.6 0.1 - 0.9 x10E3/uL   EOS (ABSOLUTE) 0.4 0.0 - 0.4 x10E3/uL   Basophils Absolute 0.1  0.0 - 0.2 x10E3/uL   Immature Granulocytes 0 Not Estab. %   Immature Grans (Abs) 0.0 0.0 - 0.1 x10E3/uL  Comprehensive metabolic panel   Collection Time: 02/06/23  3:03 PM  Result Value Ref Range   Glucose 91 70 - 99 mg/dL   BUN 13 8 - 27 mg/dL   Creatinine, Ser 3.47 0.57 - 1.00 mg/dL   eGFR 58 (L) >42 VZ/DGL/8.75   BUN/Creatinine Ratio 13 12 - 28   Sodium 141 134 - 144 mmol/L   Potassium 4.6 3.5 - 5.2 mmol/L   Chloride 100 96 - 106 mmol/L   CO2 23 20 - 29 mmol/L   Calcium 9.5 8.7 - 10.3 mg/dL   Total Protein 7.5 6.0 - 8.5 g/dL   Albumin 4.1 3.8 - 4.8 g/dL   Globulin, Total 3.4 1.5 - 4.5 g/dL   Bilirubin Total 0.3 0.0 - 1.2 mg/dL   Alkaline Phosphatase 96 44 - 121 IU/L   AST 16 0 - 40 IU/L   ALT 9 0 - 32 IU/L  Lipid panel   Collection Time: 02/06/23  3:03 PM  Result Value Ref Range   Cholesterol, Total 199 100 - 199 mg/dL   Triglycerides 643 0 - 149 mg/dL   HDL 50 >32 mg/dL   VLDL Cholesterol Cal 20 5 - 40 mg/dL   LDL Chol Calc (NIH) 951 (H) 0 - 99 mg/dL   Chol/HDL Ratio 4.0 0.0 - 4.4 ratio  TSH   Collection Time: 02/06/23  3:03 PM  Result Value Ref Range   TSH 1.890 0.450 - 4.500 uIU/mL  Iron, TIBC and Ferritin Panel   Collection Time: 02/06/23  3:03 PM  Result Value Ref Range   Total Iron Binding Capacity 368 250 - 450 ug/dL   UIBC 884 166 - 063 ug/dL   Iron 45 27 - 016 ug/dL   Iron Saturation 12 (L) 15 - 55 %   Ferritin 42 15 - 150 ng/mL      Assessment & Plan:   Problem List Items Addressed This Visit       Other   Acute cough - Primary     Follow up plan: Return if symptoms worsen or fail to improve.  This visit was completed via MyChart due to the restrictions of the COVID-19 pandemic. All issues as above were discussed and addressed. Physical exam was done as above through visual confirmation on MyChart. If it was felt that the patient should be evaluated in the office, they were directed there. The patient verbally consented to this visit. Location  of the patient: home Location of the  provider: Office Those involved with this call:  Provider: Larae Grooms, NP CMA: Irene Pap, CMA Front Desk/Registration: Servando Snare This encounter was conducted via video.  I spent 20 dedicated to the care of this patient on the date of this encounter to include previsit review of symptoms, plan of care and follow up, face to face time with the patient, and post visit ordering of testing.

## 2023-03-20 ENCOUNTER — Ambulatory Visit: Payer: PPO | Admitting: Nurse Practitioner

## 2023-04-17 ENCOUNTER — Other Ambulatory Visit: Payer: Self-pay | Admitting: Nurse Practitioner

## 2023-04-18 NOTE — Telephone Encounter (Signed)
 Requested medication (s) are due for refill today- no  Requested medication (s) are on the active medication list -no  Future visit scheduled -yes  Last refill: unknown  Notes to clinic: off protocol- provider review, no longer listed on current medication list   Requested Prescriptions  Pending Prescriptions Disp Refills   TRELEGY ELLIPTA 100-62.5-25 MCG/ACT AEPB [Pharmacy Med Name: TRELEGY ELLIPTA 100-62. INH 30P] 60 each     Sig: Inhale 1 puff into the lungs daily.     Off-Protocol Failed - 04/18/2023  8:17 AM      Failed - Medication not assigned to a protocol, review manually.      Passed - Valid encounter within last 12 months    Recent Outpatient Visits           2 months ago Annual physical exam   [place] [facility] Larae Grooms, NP   8 months ago Left hip pain   [place] [facility] [place], Sherran Needs, NP   8 months ago Left arm pain   [place] [facility] [place], [place], DO   9 months ago Essential hypertension   [place] [facility] Larae Grooms, NP   1 year ago Essential hypertension   [place] [facility] Larae Grooms, NP       Future Appointments             In 4 months Larae Grooms, NP [place] [facility]               Requested Prescriptions  Pending Prescriptions Disp Refills   TRELEGY ELLIPTA 100-62.5-25 MCG/ACT AEPB [Pharmacy Med Name: TRELEGY ELLIPTA 100-62. INH 30P] 60 each     Sig: Inhale 1 puff into the lungs daily.     Off-Protocol Failed - 04/18/2023  8:17 AM      Failed - Medication not assigned to a protocol, review manually.      Passed - Valid encounter within last 12 months    Recent Outpatient Visits           2 months ago Annual physical exam   [place] [facility] Larae Grooms, NP   8 months ago Left hip pain   [place] [facility],  Sherran Needs, NP   8 months ago Left arm pain   [place] [facility] [place], [place], DO   9 months ago Essential hypertension   [place] [facility] Larae Grooms, NP   1 year ago Essential hypertension   [place] [facility] Larae Grooms, NP       Future Appointments             In 4 months Larae Grooms, NP [place] [facility]

## 2023-04-22 ENCOUNTER — Ambulatory Visit: Payer: Self-pay | Admitting: Nurse Practitioner

## 2023-04-22 NOTE — Telephone Encounter (Signed)
 Copied from CRM [id number]. Topic: Clinical - Red Word Triage >> Apr 22, 2023 11:32 AM Patsy Lager T wrote: Red Word that prompted transfer to Nurse Triage: patient stated she has a nagging persistent cough and shortness of breath.   Chief Complaint: cough Symptoms: increase congestion- Frequency: same sx as had in Jan 2025 on and off since then, PND, mild SOB Pertinent Negatives: Patient denies fever Disposition: [] ED /[] Urgent Care (no appt availability in office) / [x] Appointment(In office/virtual)/ []  [facility]/ [] Home Care/ [] Refused Recommended Disposition /[] [facility]/ []  Follow-up with PCP Additional Notes:   Reason for Disposition  [1] Patient also has allergy symptoms (e.g., itchy eyes, clear nasal discharge, postnasal drip) AND [2] they are acting up  Answer Assessment - Initial Assessment Questions 1. ONSET: "When did the cough begin?"      Intermittent (same sx as Jan) 2. SEVERITY: "How bad is the cough today?"      Coughing a lot 3. SPUTUM: "Describe the color of your sputum" (none, dry cough; clear, white, yellow, green)     White to clear 5. DIFFICULTY BREATHING: "Are you having difficulty breathing?" If Yes, ask: "How bad is it?" (e.g., mild, moderate, severe)    - MILD: No SOB at rest, mild SOB with walking, speaks normally in sentences, can lie down, no retractions, pulse < 100.    - MODERATE: SOB at rest, SOB with minimal exertion and prefers to sit, cannot lie down flat, speaks in phrases, mild retractions, audible wheezing, pulse 100-120.    - SEVERE: Very SOB at rest, speaks in single words, struggling to breathe, sitting hunched forward, retractions, pulse > 120      Mild  6. FEVER: "Do you have a fever?" If Yes, ask: "What is your temperature, how was it measured, and when did it start?"     no 10. OTHER SYMPTOMS: "Do you have any other symptoms?" (e.g., runny nose, wheezing, chest pain)       SOB, sinus drainage  Protocols used: Cough -  Acute Non-Productive-A-AH

## 2023-04-23 ENCOUNTER — Encounter: Payer: Self-pay | Admitting: Nurse Practitioner

## 2023-04-23 ENCOUNTER — Ambulatory Visit
Admission: RE | Admit: 2023-04-23 | Discharge: 2023-04-23 | Disposition: A | Discharge status: Home / Self Care | Source: Ambulatory Visit | Attending: Nurse Practitioner | Admitting: Nurse Practitioner

## 2023-04-23 ENCOUNTER — Ambulatory Visit
Admission: RE | Admit: 2023-04-23 | Discharge: 2023-04-23 | Disposition: A | Discharge status: Home / Self Care | Attending: Nurse Practitioner | Admitting: Nurse Practitioner

## 2023-04-23 ENCOUNTER — Ambulatory Visit (INDEPENDENT_AMBULATORY_CARE_PROVIDER_SITE_OTHER): Admitting: Nurse Practitioner

## 2023-04-23 VITALS — BP 147/71 | HR 73 | Temp 99.0°F | Ht 63.6 in | Wt 233.2 lb

## 2023-04-23 DIAGNOSIS — R062 Wheezing: Secondary | ICD-10-CM | POA: Diagnosis not present

## 2023-04-23 DIAGNOSIS — I700 Atherosclerosis of aorta: Secondary | ICD-10-CM | POA: Diagnosis not present

## 2023-04-23 MED ORDER — METHYLPREDNISOLONE 4 MG PO TBPK: ORAL_TABLET | ORAL | 0 refills | Status: DC

## 2023-04-23 MED ORDER — AZITHROMYCIN 250 MG PO TABS: ORAL_TABLET | ORAL | 0 refills | Status: AC

## 2023-04-23 NOTE — Progress Notes (Signed)
 BP (!) 147/71 (BP Location: Right Arm, Patient Position: Sitting, Cuff Size: Large)   Pulse 73   Temp 99 F (37.2 C) (Oral)   Ht 5' 3.6" (1.615 m)   Wt 233 lb 3.2 oz (105.8 kg)   SpO2 96%   BMI 40.53 kg/m    Subjective:    Patient ID: Dawn Gonzalez, female    DOB: [birth year], 78 y.o.   MRN: [id number]  HPI: Dawn Gonzalez is a 78 y.o. female  Chief Complaint  Patient presents with   Cough    Persistent, worse at night and first thing in the morning, started last week, over the weekend cough has become worse.    Shortness of Breath   UPPER RESPIRATORY TRACT INFECTION Worst symptom: symptoms started on Friday Fever: no Cough: yes Shortness of breath: yes Wheezing: yes Chest pain: no Chest tightness: yes Chest congestion: yes Nasal congestion: yes Runny nose: yes Post nasal drip: yes Sneezing: no Sore throat: no Swollen glands: no Sinus pressure: no Headache: no Face pain: no Toothache: no Ear pain: no bilateral Ear pressure: no bilateral Eyes red/itching:no Eye drainage/crusting: no  Vomiting: no Rash: no Fatigue: yes Sick contacts: no Strep contacts: no  Context: stable Recurrent sinusitis: no Relief with OTC cold/cough medications: no Treatments attempted: mucinex   Relevant past medical, surgical, family and social history reviewed and updated as indicated. Interim medical history since our last visit reviewed. Allergies and medications reviewed and updated.  Review of Systems  Constitutional:  Positive for fatigue. Negative for fever.  HENT:  Positive for congestion. Negative for dental problem, ear pain, postnasal drip, rhinorrhea, sinus pressure, sinus pain, sneezing and sore throat.   Respiratory:  Positive for cough, shortness of breath and wheezing.   Cardiovascular:  Negative for chest pain.  Gastrointestinal:  Negative for vomiting.  Skin:  Negative for rash.  Neurological:  Negative for headaches.    Per HPI unless specifically  indicated above     Objective:    BP (!) 147/71 (BP Location: Right Arm, Patient Position: Sitting, Cuff Size: Large)   Pulse 73   Temp 99 F (37.2 C) (Oral)   Ht 5' 3.6" (1.615 m)   Wt 233 lb 3.2 oz (105.8 kg)   SpO2 96%   BMI 40.53 kg/m   Wt Readings from Last 3 Encounters:  04/23/23 233 lb 3.2 oz (105.8 kg)  02/06/23 230 lb (104.3 kg)  01/01/23 229 lb (103.9 kg)    Physical Exam Vitals and nursing note reviewed.  Constitutional:      General: She is not in acute distress.    Appearance: Normal appearance. She is normal weight. She is not ill-appearing, toxic-appearing or diaphoretic.  HENT:     Head: Normocephalic.     Right Ear: External ear normal.     Left Ear: External ear normal.     Nose: Nose normal.     Mouth/Throat:     Mouth: Mucous membranes are moist.     Pharynx: Oropharynx is clear.  Eyes:     General:        Right eye: No discharge.        Left eye: No discharge.     Extraocular Movements: Extraocular movements intact.     Conjunctiva/sclera: Conjunctivae normal.     Pupils: Pupils are equal, round, and reactive to light.  Cardiovascular:     Rate and Rhythm: Normal rate and regular rhythm.     Heart sounds: No murmur heard.  Pulmonary:     Effort: Pulmonary effort is normal. No respiratory distress.     Breath sounds: Wheezing present. No rales.  Musculoskeletal:     Cervical back: Normal range of motion and neck supple.  Skin:    General: Skin is warm and dry.     Capillary Refill: Capillary refill takes less than 2 seconds.  Neurological:     General: No focal deficit present.     Mental Status: She is alert and oriented to person, place, and time. Mental status is at baseline.  Psychiatric:        Mood and Affect: Mood normal.        Behavior: Behavior normal.        Thought Content: Thought content normal.        Judgment: Judgment normal.     Results for orders placed or performed in visit on 02/06/23  Urinalysis, Routine w reflex  microscopic   Collection Time: 02/06/23  3:02 PM  Result Value Ref Range   Specific Gravity, UA 1.020 1.005 - 1.030   pH, UA 6.0 5.0 - 7.5   Color, UA Yellow Yellow   Appearance Ur Clear Clear   Leukocytes,UA Negative Negative   Protein,UA Negative Negative/Trace   Glucose, UA Negative Negative   Ketones, UA Negative Negative   RBC, UA Negative Negative   Bilirubin, UA Negative Negative   Urobilinogen, Ur 0.2 0.2 - 1.0 mg/dL   Nitrite, UA Negative Negative   Microscopic Examination Comment   CBC with Differential/Platelet   Collection Time: 02/06/23  3:03 PM  Result Value Ref Range   WBC 9.5 3.4 - 10.8 x10E3/uL   RBC 4.92 3.77 - 5.28 x10E6/uL   Hemoglobin 13.2 11.1 - 15.9 g/dL   Hematocrit 40.9 81.1 - 46.6 %   MCV 86 79 - 97 fL   MCH 26.8 26.6 - 33.0 pg   MCHC 31.2 (L) 31.5 - 35.7 g/dL   RDW 91.4 78.2 - 95.6 %   Platelets 243 150 - 450 x10E3/uL   Neutrophils 65 Not Estab. %   Lymphs 23 Not Estab. %   Monocytes 7 Not Estab. %   Eos 4 Not Estab. %   Basos 1 Not Estab. %   Neutrophils Absolute 6.2 1.4 - 7.0 x10E3/uL   Lymphocytes Absolute 2.2 0.7 - 3.1 x10E3/uL   Monocytes Absolute 0.6 0.1 - 0.9 x10E3/uL   EOS (ABSOLUTE) 0.4 0.0 - 0.4 x10E3/uL   Basophils Absolute 0.1 0.0 - 0.2 x10E3/uL   Immature Granulocytes 0 Not Estab. %   Immature Grans (Abs) 0.0 0.0 - 0.1 x10E3/uL  Comprehensive metabolic panel   Collection Time: 02/06/23  3:03 PM  Result Value Ref Range   Glucose 91 70 - 99 mg/dL   BUN 13 8 - 27 mg/dL   Creatinine, Ser 2.13 0.57 - 1.00 mg/dL   eGFR 58 (L) >08 MV/HQI/6.96   BUN/Creatinine Ratio 13 12 - 28   Sodium 141 134 - 144 mmol/L   Potassium 4.6 3.5 - 5.2 mmol/L   Chloride 100 96 - 106 mmol/L   CO2 23 20 - 29 mmol/L   Calcium 9.5 8.7 - 10.3 mg/dL   Total Protein 7.5 6.0 - 8.5 g/dL   Albumin 4.1 3.8 - 4.8 g/dL   Globulin, Total 3.4 1.5 - 4.5 g/dL   Bilirubin Total 0.3 0.0 - 1.2 mg/dL   Alkaline Phosphatase 96 44 - 121 IU/L   AST 16 0 - 40 IU/L   ALT 9  0 - 32 IU/L  Lipid panel   Collection Time: 02/06/23  3:03 PM  Result Value Ref Range   Cholesterol, Total 199 100 - 199 mg/dL   Triglycerides 469 0 - 149 mg/dL   HDL 50 >62 mg/dL   VLDL Cholesterol Cal 20 5 - 40 mg/dL   LDL Chol Calc (NIH) 952 (H) 0 - 99 mg/dL   Chol/HDL Ratio 4.0 0.0 - 4.4 ratio  TSH   Collection Time: 02/06/23  3:03 PM  Result Value Ref Range   TSH 1.890 0.450 - 4.500 uIU/mL  Iron, TIBC and Ferritin Panel   Collection Time: 02/06/23  3:03 PM  Result Value Ref Range   Total Iron Binding Capacity 368 250 - 450 ug/dL   UIBC 841 324 - 401 ug/dL   Iron 45 27 - 027 ug/dL   Iron Saturation 12 (L) 15 - 55 %   Ferritin 42 15 - 150 ng/mL      Assessment & Plan:   Problem List Items Addressed This Visit   None Visit Diagnoses       Wheezing    -  Primary   Will obtain chest xray to rule out pneumonia. Will treat with azithromycin and medrol dose pak.  Follow up in 2 days. Call sooner if concerns arise.   Relevant Orders   DG Chest 2 View        Follow up plan: Return in about 2 days (around 04/25/2023) for Lung Check.

## 2023-04-25 ENCOUNTER — Encounter: Payer: Self-pay | Admitting: Nurse Practitioner

## 2023-04-25 ENCOUNTER — Ambulatory Visit (INDEPENDENT_AMBULATORY_CARE_PROVIDER_SITE_OTHER): Admitting: Nurse Practitioner

## 2023-04-25 VITALS — BP 175/77 | HR 82 | Ht 63.6 in | Wt 232.2 lb

## 2023-04-25 DIAGNOSIS — I10 Essential (primary) hypertension: Secondary | ICD-10-CM | POA: Diagnosis not present

## 2023-04-25 DIAGNOSIS — R062 Wheezing: Secondary | ICD-10-CM | POA: Diagnosis not present

## 2023-04-25 MED ORDER — TRELEGY ELLIPTA 100-62.5-25 MCG/ACT IN AEPB: 1.0000 | INHALATION_SPRAY | Freq: Every day | RESPIRATORY_TRACT | 11 refills | Status: DC

## 2023-04-25 NOTE — Progress Notes (Signed)
 BP (!) 175/77 (BP Location: Left Arm, Patient Position: Sitting, Cuff Size: Large)   Pulse 82   Ht 5' 3.6" (1.615 m)   Wt 232 lb 3.2 oz (105.3 kg)   SpO2 97%   BMI 40.36 kg/m    Subjective:    Patient ID: Dawn Gonzalez, female    DOB: 12-08-1945, 78 y.o.   MRN: [id number]  HPI: Dawn Gonzalez is a 78 y.o. female  Chief Complaint  Patient presents with   lung check    UPPER RESPIRATORY TRACT INFECTION Feels like she is a little better.  She didn't hear any rattling last night which was an improvement.  Feels like her fatigue is improving some also. She is still having a cough.   04/23/23: Worst symptom: symptoms started on Friday Fever: no Cough: yes Shortness of breath: yes Wheezing: yes Chest pain: no Chest tightness: yes Chest congestion: yes Nasal congestion: yes Runny nose: yes Post nasal drip: yes Sneezing: no Sore throat: no Swollen glands: no Sinus pressure: no Headache: no Face pain: no Toothache: no Ear pain: no bilateral Ear pressure: no bilateral Eyes red/itching:no Eye drainage/crusting: no  Vomiting: no Rash: no Fatigue: yes Sick contacts: no Strep contacts: no  Context: stable Recurrent sinusitis: no Relief with OTC cold/cough medications: no Treatments attempted: mucinex   She was not able to pick up the Breo inhaler.  States they will cover Trelegy.  Relevant past medical, surgical, family and social history reviewed and updated as indicated. Interim medical history since our last visit reviewed. Allergies and medications reviewed and updated.  Review of Systems  Constitutional:  Negative for fatigue.  Respiratory:  Positive for cough. Negative for wheezing.     Per HPI unless specifically indicated above     Objective:    BP (!) 175/77 (BP Location: Left Arm, Patient Position: Sitting, Cuff Size: Large)   Pulse 82   Ht 5' 3.6" (1.615 m)   Wt 232 lb 3.2 oz (105.3 kg)   SpO2 97%   BMI 40.36 kg/m   Wt Readings from Last 3  Encounters:  04/25/23 232 lb 3.2 oz (105.3 kg)  04/23/23 233 lb 3.2 oz (105.8 kg)  02/06/23 230 lb (104.3 kg)    Physical Exam Vitals and nursing note reviewed.  Constitutional:      General: She is not in acute distress.    Appearance: Normal appearance. She is normal weight. She is not ill-appearing, toxic-appearing or diaphoretic.  HENT:     Head: Normocephalic.     Right Ear: External ear normal.     Left Ear: External ear normal.     Nose: Nose normal.     Mouth/Throat:     Mouth: Mucous membranes are moist.     Pharynx: Oropharynx is clear.  Eyes:     General:        Right eye: No discharge.        Left eye: No discharge.     Extraocular Movements: Extraocular movements intact.     Conjunctiva/sclera: Conjunctivae normal.     Pupils: Pupils are equal, round, and reactive to light.  Cardiovascular:     Rate and Rhythm: Normal rate and regular rhythm.     Heart sounds: No murmur heard. Pulmonary:     Effort: Pulmonary effort is normal. No respiratory distress.     Breath sounds: No wheezing or rales.  Musculoskeletal:     Cervical back: Normal range of motion and neck supple.  Skin:  General: Skin is warm and dry.     Capillary Refill: Capillary refill takes less than 2 seconds.  Neurological:     General: No focal deficit present.     Mental Status: She is alert and oriented to person, place, and time. Mental status is at baseline.  Psychiatric:        Mood and Affect: Mood normal.        Behavior: Behavior normal.        Thought Content: Thought content normal.        Judgment: Judgment normal.     Results for orders placed or performed in visit on 02/06/23  Urinalysis, Routine w reflex microscopic   Collection Time: 02/06/23  3:02 PM  Result Value Ref Range   Specific Gravity, UA 1.020 1.005 - 1.030   pH, UA 6.0 5.0 - 7.5   Color, UA Yellow Yellow   Appearance Ur Clear Clear   Leukocytes,UA Negative Negative   Protein,UA Negative Negative/Trace    Glucose, UA Negative Negative   Ketones, UA Negative Negative   RBC, UA Negative Negative   Bilirubin, UA Negative Negative   Urobilinogen, Ur 0.2 0.2 - 1.0 mg/dL   Nitrite, UA Negative Negative   Microscopic Examination Comment   CBC with Differential/Platelet   Collection Time: 02/06/23  3:03 PM  Result Value Ref Range   WBC 9.5 3.4 - 10.8 x10E3/uL   RBC 4.92 3.77 - 5.28 x10E6/uL   Hemoglobin 13.2 11.1 - 15.9 g/dL   Hematocrit 13.0 86.5 - 46.6 %   MCV 86 79 - 97 fL   MCH 26.8 26.6 - 33.0 pg   MCHC 31.2 (L) 31.5 - 35.7 g/dL   RDW 78.4 69.6 - 29.5 %   Platelets 243 150 - 450 x10E3/uL   Neutrophils 65 Not Estab. %   Lymphs 23 Not Estab. %   Monocytes 7 Not Estab. %   Eos 4 Not Estab. %   Basos 1 Not Estab. %   Neutrophils Absolute 6.2 1.4 - 7.0 x10E3/uL   Lymphocytes Absolute 2.2 0.7 - 3.1 x10E3/uL   Monocytes Absolute 0.6 0.1 - 0.9 x10E3/uL   EOS (ABSOLUTE) 0.4 0.0 - 0.4 x10E3/uL   Basophils Absolute 0.1 0.0 - 0.2 x10E3/uL   Immature Granulocytes 0 Not Estab. %   Immature Grans (Abs) 0.0 0.0 - 0.1 x10E3/uL  Comprehensive metabolic panel   Collection Time: 02/06/23  3:03 PM  Result Value Ref Range   Glucose 91 70 - 99 mg/dL   BUN 13 8 - 27 mg/dL   Creatinine, Ser 2.84 0.57 - 1.00 mg/dL   eGFR 58 (L) >13 KG/MWN/0.27   BUN/Creatinine Ratio 13 12 - 28   Sodium 141 134 - 144 mmol/L   Potassium 4.6 3.5 - 5.2 mmol/L   Chloride 100 96 - 106 mmol/L   CO2 23 20 - 29 mmol/L   Calcium 9.5 8.7 - 10.3 mg/dL   Total Protein 7.5 6.0 - 8.5 g/dL   Albumin 4.1 3.8 - 4.8 g/dL   Globulin, Total 3.4 1.5 - 4.5 g/dL   Bilirubin Total 0.3 0.0 - 1.2 mg/dL   Alkaline Phosphatase 96 44 - 121 IU/L   AST 16 0 - 40 IU/L   ALT 9 0 - 32 IU/L  Lipid panel   Collection Time: 02/06/23  3:03 PM  Result Value Ref Range   Cholesterol, Total 199 100 - 199 mg/dL   Triglycerides 253 0 - 149 mg/dL   HDL 50 >66 mg/dL  VLDL Cholesterol Cal 20 5 - 40 mg/dL   LDL Chol Calc (NIH) 161 (H) 0 - 99 mg/dL    Chol/HDL Ratio 4.0 0.0 - 4.4 ratio  TSH   Collection Time: 02/06/23  3:03 PM  Result Value Ref Range   TSH 1.890 0.450 - 4.500 uIU/mL  Iron, TIBC and Ferritin Panel   Collection Time: 02/06/23  3:03 PM  Result Value Ref Range   Total Iron Binding Capacity 368 250 - 450 ug/dL   UIBC 096 045 - 409 ug/dL   Iron 45 27 - 811 ug/dL   Iron Saturation 12 (L) 15 - 55 %   Ferritin 42 15 - 150 ng/mL      Assessment & Plan:   Problem List Items Addressed This Visit       Cardiovascular and Mediastinum   Essential hypertension   Chronic.  Elevated at visit today.  Will follow up in 1 month.  If Gonzalez pressure is still elevated, will adjust medications at that time.       Other Visit Diagnoses       Wheezing    -  Primary   No wheezing on exam today.  No evidence of pneumonia on xray.  Symptoms are improving. Complete course of medications.  Follow up if not improved.         Follow up plan: Return in about 1 month (around 05/26/2023) for BP Check.

## 2023-04-25 NOTE — Assessment & Plan Note (Signed)
 Chronic.  Elevated at visit today.  Will follow up in 1 month.  If blood pressure is still elevated, will adjust medications at that time.

## 2023-05-11 ENCOUNTER — Other Ambulatory Visit: Payer: Self-pay | Admitting: Nurse Practitioner

## 2023-05-13 NOTE — Telephone Encounter (Signed)
 Too soon for refill, last refill 02/06/23 for 90 and 1 RF.  Requested Prescriptions  Pending Prescriptions Disp Refills   hydrochlorothiazide (HYDRODIURIL) 25 MG tablet [Pharmacy Med Name: HYDROCHLOROTHIAZIDE 25MG  TABLETS] 90 tablet 1    Sig: TAKE 1 TABLET(25 MG) BY MOUTH DAILY     Cardiovascular: Diuretics - Thiazide Failed - 05/13/2023 11:45 AM      Failed - Last BP in normal range    BP Readings from Last 1 Encounters:  04/25/23 (!) 175/77         Passed - Cr in normal range and within 180 days    Creatinine, Ser  Date Value Ref Range Status  02/06/2023 1.00 0.57 - 1.00 mg/dL Final         Passed - K in normal range and within 180 days    Potassium  Date Value Ref Range Status  02/06/2023 4.6 3.5 - 5.2 mmol/L Final         Passed - Na in normal range and within 180 days    Sodium  Date Value Ref Range Status  02/06/2023 141 134 - 144 mmol/L Final         Passed - Valid encounter within last 6 months    Recent Outpatient Visits           2 weeks ago Wheezing   [facility] Aileen Alexanders, NP   2 weeks ago Wheezing   [facility] Aileen Alexanders, NP   1 month ago Acute cough   [facility] Aileen Alexanders, NP       Future Appointments             In 3 months Aileen Alexanders, NP [facility]

## 2023-05-25 DIAGNOSIS — I469 Cardiac arrest, cause unspecified: Secondary | ICD-10-CM | POA: Diagnosis not present

## 2023-05-25 DIAGNOSIS — Z743 Need for continuous supervision: Secondary | ICD-10-CM | POA: Diagnosis not present

## 2023-05-27 ENCOUNTER — Telehealth: Payer: Self-pay | Admitting: Nurse Practitioner

## 2023-05-27 NOTE — Telephone Encounter (Signed)
 Coroner's office from [place]  called to let our office know that the patient was on a bus trip with her daughter when she became unresponsive.  Unfortunately, patient did not regain consciousness and ultimately passed away.  Coroner's office does plan to sign the death certificate but wanted to let us  know.

## 2023-05-30 ENCOUNTER — Ambulatory Visit: Admitting: Nurse Practitioner

## 2023-05-30 DEATH — deceased

## 2023-08-16 ENCOUNTER — Ambulatory Visit: Payer: Self-pay | Admitting: Nurse Practitioner

## 2024-03-06 ENCOUNTER — Other Ambulatory Visit: Payer: Self-pay | Admitting: Nurse Practitioner
